# Patient Record
Sex: Female | Born: 1960
Health system: Southern US, Community
[De-identification: ages and names within clinical notes are randomized; demographics above are authoritative.]

## PROBLEM LIST (undated history)

## (undated) DIAGNOSIS — J45909 Unspecified asthma, uncomplicated: Principal | ICD-10-CM

## (undated) DIAGNOSIS — I4949 Other premature depolarization: Secondary | ICD-10-CM

## (undated) DIAGNOSIS — K760 Fatty (change of) liver, not elsewhere classified: Secondary | ICD-10-CM

## (undated) DIAGNOSIS — M359 Systemic involvement of connective tissue, unspecified: Secondary | ICD-10-CM

## (undated) DIAGNOSIS — R030 Elevated blood-pressure reading, without diagnosis of hypertension: Secondary | ICD-10-CM

## (undated) DIAGNOSIS — K7581 Nonalcoholic steatohepatitis (NASH): Secondary | ICD-10-CM

## (undated) DIAGNOSIS — T7840XA Allergy, unspecified, initial encounter: Secondary | ICD-10-CM

## (undated) DIAGNOSIS — E785 Hyperlipidemia, unspecified: Secondary | ICD-10-CM

## (undated) DIAGNOSIS — E079 Disorder of thyroid, unspecified: Secondary | ICD-10-CM

## (undated) HISTORY — DX: Systemic involvement of connective tissue, unspecified: M35.9

## (undated) HISTORY — PX: ABDOMINAL HYSTERECTOMY: SHX81

## (undated) HISTORY — DX: Allergy, unspecified, initial encounter: T78.40XA

## (undated) HISTORY — PX: KNEE ARTHROSCOPY: SUR90

## (undated) HISTORY — DX: Elevated blood-pressure reading, without diagnosis of hypertension: R03.0

## (undated) HISTORY — PX: COLONOSCOPY: SHX174

## (undated) HISTORY — DX: Other premature depolarization: I49.49

## (undated) HISTORY — DX: Unspecified asthma, uncomplicated: J45.909

## (undated) HISTORY — DX: Hyperlipidemia, unspecified: E78.5

---

## 1993-02-26 HISTORY — PX: GANGLION CYST EXCISION: SHX1691

## 1998-02-16 ENCOUNTER — Encounter: Admission: RE | Admit: 1998-02-16 | Discharge: 1998-03-02 | Payer: Self-pay | Admitting: Family Medicine

## 1998-04-05 ENCOUNTER — Encounter: Admission: RE | Admit: 1998-04-05 | Discharge: 1998-07-04 | Payer: Self-pay | Admitting: Family Medicine

## 1999-07-05 ENCOUNTER — Other Ambulatory Visit: Admission: RE | Admit: 1999-07-05 | Discharge: 1999-07-05 | Payer: Self-pay | Admitting: Obstetrics and Gynecology

## 1999-09-01 ENCOUNTER — Ambulatory Visit (HOSPITAL_COMMUNITY): Admission: RE | Admit: 1999-09-01 | Discharge: 1999-09-01 | Payer: Self-pay | Admitting: Obstetrics and Gynecology

## 2000-12-25 ENCOUNTER — Other Ambulatory Visit: Admission: RE | Admit: 2000-12-25 | Discharge: 2000-12-25 | Payer: Self-pay | Admitting: Obstetrics and Gynecology

## 2001-08-15 ENCOUNTER — Inpatient Hospital Stay (HOSPITAL_COMMUNITY): Admission: AD | Admit: 2001-08-15 | Discharge: 2001-08-18 | Payer: Self-pay | Admitting: Obstetrics and Gynecology

## 2001-08-15 ENCOUNTER — Encounter (INDEPENDENT_AMBULATORY_CARE_PROVIDER_SITE_OTHER): Payer: Self-pay

## 2003-01-23 ENCOUNTER — Emergency Department (HOSPITAL_COMMUNITY): Admission: EM | Admit: 2003-01-23 | Discharge: 2003-01-23 | Payer: Self-pay | Admitting: Emergency Medicine

## 2004-06-14 ENCOUNTER — Encounter: Admission: RE | Admit: 2004-06-14 | Discharge: 2004-06-14 | Payer: Self-pay | Admitting: Family Medicine

## 2004-06-19 ENCOUNTER — Encounter: Admission: RE | Admit: 2004-06-19 | Discharge: 2004-06-19 | Payer: Self-pay | Admitting: Family Medicine

## 2004-06-20 ENCOUNTER — Encounter: Admission: RE | Admit: 2004-06-20 | Discharge: 2004-06-20 | Payer: Self-pay | Admitting: Family Medicine

## 2005-10-31 ENCOUNTER — Ambulatory Visit (HOSPITAL_COMMUNITY): Admission: RE | Admit: 2005-10-31 | Discharge: 2005-10-31 | Payer: Self-pay | Admitting: Orthopedic Surgery

## 2005-11-01 ENCOUNTER — Ambulatory Visit (HOSPITAL_BASED_OUTPATIENT_CLINIC_OR_DEPARTMENT_OTHER): Admission: RE | Admit: 2005-11-01 | Discharge: 2005-11-01 | Payer: Self-pay | Admitting: Orthopedic Surgery

## 2005-11-13 ENCOUNTER — Encounter: Admission: RE | Admit: 2005-11-13 | Discharge: 2006-01-02 | Payer: Self-pay | Admitting: Orthopedic Surgery

## 2005-12-10 ENCOUNTER — Ambulatory Visit: Payer: Self-pay | Admitting: Family Medicine

## 2006-12-09 ENCOUNTER — Telehealth: Payer: Self-pay | Admitting: Family Medicine

## 2007-01-03 ENCOUNTER — Telehealth: Payer: Self-pay | Admitting: Family Medicine

## 2007-01-08 ENCOUNTER — Ambulatory Visit: Payer: Self-pay | Admitting: Licensed Clinical Social Worker

## 2007-01-16 ENCOUNTER — Ambulatory Visit: Payer: Self-pay | Admitting: Licensed Clinical Social Worker

## 2007-01-22 ENCOUNTER — Ambulatory Visit: Payer: Self-pay | Admitting: Licensed Clinical Social Worker

## 2007-02-04 ENCOUNTER — Ambulatory Visit: Payer: Self-pay | Admitting: Licensed Clinical Social Worker

## 2007-02-10 ENCOUNTER — Ambulatory Visit: Payer: Self-pay | Admitting: Licensed Clinical Social Worker

## 2007-03-06 ENCOUNTER — Ambulatory Visit: Payer: Self-pay | Admitting: Licensed Clinical Social Worker

## 2007-03-27 ENCOUNTER — Ambulatory Visit: Payer: Self-pay | Admitting: Licensed Clinical Social Worker

## 2007-05-01 ENCOUNTER — Ambulatory Visit: Payer: Self-pay | Admitting: Licensed Clinical Social Worker

## 2007-07-08 ENCOUNTER — Telehealth (INDEPENDENT_AMBULATORY_CARE_PROVIDER_SITE_OTHER): Payer: Self-pay | Admitting: *Deleted

## 2007-10-30 ENCOUNTER — Encounter: Payer: Self-pay | Admitting: Family Medicine

## 2007-10-30 ENCOUNTER — Ambulatory Visit: Payer: Self-pay | Admitting: Family Medicine

## 2007-11-04 DIAGNOSIS — D2370 Other benign neoplasm of skin of unspecified lower limb, including hip: Secondary | ICD-10-CM | POA: Insufficient documentation

## 2007-12-12 ENCOUNTER — Ambulatory Visit: Payer: Self-pay | Admitting: Family Medicine

## 2007-12-26 ENCOUNTER — Ambulatory Visit: Payer: Self-pay | Admitting: Family Medicine

## 2007-12-26 DIAGNOSIS — R030 Elevated blood-pressure reading, without diagnosis of hypertension: Secondary | ICD-10-CM

## 2007-12-26 HISTORY — DX: Elevated blood-pressure reading, without diagnosis of hypertension: R03.0

## 2008-01-27 ENCOUNTER — Ambulatory Visit (HOSPITAL_BASED_OUTPATIENT_CLINIC_OR_DEPARTMENT_OTHER): Admission: RE | Admit: 2008-01-27 | Discharge: 2008-01-27 | Payer: Self-pay | Admitting: Orthopedic Surgery

## 2008-02-27 HISTORY — PX: WRIST RECONSTRUCTION: SHX2675

## 2008-03-05 ENCOUNTER — Telehealth: Payer: Self-pay | Admitting: Family Medicine

## 2008-06-15 ENCOUNTER — Telehealth: Payer: Self-pay | Admitting: Family Medicine

## 2009-01-19 ENCOUNTER — Telehealth: Payer: Self-pay | Admitting: Family Medicine

## 2009-02-14 ENCOUNTER — Telehealth: Payer: Self-pay | Admitting: Family Medicine

## 2009-04-01 ENCOUNTER — Telehealth: Payer: Self-pay | Admitting: Family Medicine

## 2009-04-19 ENCOUNTER — Ambulatory Visit: Payer: Self-pay | Admitting: Family Medicine

## 2009-04-19 DIAGNOSIS — M1990 Unspecified osteoarthritis, unspecified site: Secondary | ICD-10-CM | POA: Insufficient documentation

## 2009-04-19 DIAGNOSIS — M359 Systemic involvement of connective tissue, unspecified: Secondary | ICD-10-CM

## 2009-04-19 DIAGNOSIS — R0789 Other chest pain: Secondary | ICD-10-CM | POA: Insufficient documentation

## 2009-04-19 HISTORY — DX: Systemic involvement of connective tissue, unspecified: M35.9

## 2009-04-19 LAB — CONVERTED CEMR LAB
Folate: 11.8 ng/mL
Rheumatoid fact SerPl-aCnc: <20 intl units/mL (ref 0.0–20.0)
Sed Rate: 24 mm/hr — ABNORMAL HIGH (ref 0–22)
Vitamin B-12: 495 pg/mL (ref 211–911)

## 2009-04-20 ENCOUNTER — Telehealth: Payer: Self-pay | Admitting: Family Medicine

## 2009-04-20 ENCOUNTER — Encounter: Payer: Self-pay | Admitting: Family Medicine

## 2009-04-20 LAB — CONVERTED CEMR LAB: Anti Nuclear Antibody(ANA): NEGATIVE

## 2009-04-26 ENCOUNTER — Telehealth: Payer: Self-pay | Admitting: Family Medicine

## 2009-04-26 ENCOUNTER — Ambulatory Visit: Payer: Self-pay | Admitting: Family Medicine

## 2009-04-26 DIAGNOSIS — J45909 Unspecified asthma, uncomplicated: Secondary | ICD-10-CM

## 2009-04-26 HISTORY — DX: Unspecified asthma, uncomplicated: J45.909

## 2009-04-27 ENCOUNTER — Ambulatory Visit: Payer: Self-pay | Admitting: Family Medicine

## 2009-04-29 ENCOUNTER — Ambulatory Visit: Payer: Self-pay | Admitting: Family Medicine

## 2009-05-31 ENCOUNTER — Telehealth: Payer: Self-pay | Admitting: Family Medicine

## 2009-06-02 ENCOUNTER — Encounter: Payer: Self-pay | Admitting: Family Medicine

## 2009-07-09 DIAGNOSIS — I4949 Other premature depolarization: Secondary | ICD-10-CM

## 2009-07-09 HISTORY — DX: Other premature depolarization: I49.49

## 2009-07-12 ENCOUNTER — Ambulatory Visit: Payer: Self-pay | Admitting: Family Medicine

## 2009-07-12 ENCOUNTER — Telehealth: Payer: Self-pay | Admitting: Family Medicine

## 2009-08-02 ENCOUNTER — Ambulatory Visit: Payer: Self-pay

## 2009-08-02 ENCOUNTER — Ambulatory Visit (HOSPITAL_COMMUNITY): Admission: RE | Admit: 2009-08-02 | Discharge: 2009-08-02 | Payer: Self-pay | Admitting: Family Medicine

## 2009-08-02 ENCOUNTER — Ambulatory Visit: Payer: Self-pay | Admitting: Cardiovascular Disease

## 2009-08-02 ENCOUNTER — Encounter: Payer: Self-pay | Admitting: Family Medicine

## 2010-03-19 ENCOUNTER — Encounter: Payer: Self-pay | Admitting: Family Medicine

## 2010-03-22 ENCOUNTER — Emergency Department (HOSPITAL_BASED_OUTPATIENT_CLINIC_OR_DEPARTMENT_OTHER)
Admission: EM | Admit: 2010-03-22 | Discharge: 2010-03-22 | Payer: Self-pay | Source: Home / Self Care | Admitting: Emergency Medicine

## 2010-03-28 NOTE — Letter (Signed)
Summary: Referral - not able to see patient  [facility]  [address] [place], [place] [address]   Phone: [phone]  Fax: [phone]    June 02, 2009    Tinnie Gens A. Tawanna Cooler, M.D. [address] [place], [place] [address]   Re:   Laura Caldwell DOB:  08-Apr-1960 MRN:   [id number]    Dear Dr. Tawanna Cooler:  Thank you for your kind referral of the above patient.  We have attempted to schedule the recommended procedure Screening Colonoscopy but have not been able to schedule because:  ___ The patient was not available by phone and/or has not returned our calls.   X  The patient declined to schedule the procedure at this time.  We appreciate the referral and hope that we will have the opportunity to treat this patient in the future.    Sincerely,    [facility] [phone]

## 2010-03-28 NOTE — Assessment & Plan Note (Signed)
Summary: ACUTE/HEART ISSUES/RCD   Vital Signs:  Patient profile:   50 year old female Temp:     98 degrees F Pulse rate:   70 / minute Pulse rhythm:   irregular Resp:     12 per minute  History of Present Illness: Laura Caldwell is a 50 year old, married female, nonsmoker, who comes in today for evaluation of skipping heart beat.  She states over the weekend.  She noticed her heart skipping.  He comes in to [place].  It's not associated with chest pain, shortness of breath, etc..  It usually occurs at rest.  He will skip every fourth beat and then last for a couple minutes and stopped.  She's never had history of any cardiac or pulmonary disease.  She only drinks 1 cup of caffeinated coffee daily.  Does not drink alcohol nor smoke.  She recently started an exercise program because she states at age 50.  It's time to lose weight.    Allergies: No Known Drug Allergies  Past History:  Past medical, surgical, family and social histories (including risk factors) reviewed for relevance to current acute and chronic problems.  Past Medical History: Reviewed history from 12/12/2007 and no changes required. childbirth x 2 hysterectomy for nonmalignant reasons lumbar disk disease  Family History: Reviewed history from 12/12/2007 and no changes required. Family History of Alcoholism/Addiction Family History Hypertension  Social History: Reviewed history from 12/12/2007 and no changes required. Occupation:  Charity fundraiser Married Never Smoked Alcohol use-no Drug use-no Regular exercise-no  Review of Systems      See HPI  Physical Exam  General:  Well-developed,well-nourished,in no acute distress; alert,appropriate and cooperative throughout examination Neck:  No deformities, masses, or tenderness noted. Lungs:  Normal respiratory effort, chest expands symmetrically. Lungs are clear to auscultation, no crackles or wheezes. Heart:  Normal rate and regular rhythm. S1 and S2 normal without gallop, murmur,  click, rub or other extra sounds.   Impression & Recommendations:  Problem # 1:  PREMATURE VENTRICULAR CONTRACTIONS (ICD-427.69) Assessment New  Orders: Echo Referral (Echo) EKG w/ Interpretation (93000)  Complete Medication List: 1)  Zomig Zmt 5 Mg Tbdp (Zolmitriptan) .... Take 1 tablet by mouth 2)  Levothroid 50 Mcg Tabs (Levothyroxine sodium) .... Take one tab once daily 3)  Hydrochlorothiazide 25 Mg Tabs (Hydrochlorothiazide) .... Take 1 tablet by mouth every morning 4)  Promethazine-codeine 6.25-10 Mg/65ml Syrp (Promethazine-codeine) .Marland Kitchen.. 1-2 t at bedtime as needed 5)  Prednisone 20 Mg Tabs (Prednisone) .... Uad 6)  Doxycycline Hyclate 100 Mg Caps (Doxycycline hyclate) .... Take 1 tablet by mouth two times a day 7)  Ativan 0.5 Mg Tabs (Lorazepam) .Marland Kitchen.. 1 tab @ bedtime 8)  Promethazine-codeine 6.25-10 Mg/41ml Syrp (Promethazine-codeine) .Marland Kitchen.. 1 or 2 tsps three times a day as needed cough  Patient Instructions: 1)  we discussed a child of a caffeine free diet. 2)  We also discussed medications. 3)  We will get a 2-D echo to be sure.  Her bowels and heart muscle is normal.

## 2010-03-28 NOTE — Progress Notes (Signed)
Summary: tick bite  Phone Note Call from Patient   Caller: Patient Call For: Roderick Pee MD Summary of Call: Pt pulled a tick off of her left hip, and wants to know if she needs to be seen or any meds.? [facility] [facility] [phone] Initial call taken by: Lynann Beaver CMA,  May 31, 2009 10:57 AM  Follow-up for Phone Call        Phone Call Completed Follow-up by: Roderick Pee MD,  May 31, 2009 12:56 PM

## 2010-03-28 NOTE — Progress Notes (Signed)
Summary: lab tests  Phone Note Call from Patient   Caller: Patient Reason for Call: Talk to Doctor Summary of Call: patient is calling because she is coming in for her lab work this afternoon.  she says that her insurance company will not cover all of the test ordered because she just had them done at her GYN.  is it okay to leave off some of the tests?  her cell number is [phone] Initial call taken by: Kern Reap CMA Duncan Dull),  April 20, 2009 9:05 AM  Follow-up for Phone Call        okay to scratch off, if it's already been done Follow-up by: Roderick Pee MD,  April 20, 2009 9:11 AM  Additional Follow-up for Phone Call Additional follow up Details #1::        patient is aware Additional Follow-up by: Kern Reap CMA Duncan Dull),  April 20, 2009 10:04 AM

## 2010-03-28 NOTE — Assessment & Plan Note (Signed)
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  They wish to  She has a history of underlying migraine headaches for which he takes Zomig p.r.n.  She also uses Flexeril and Vicodin p.r.n. for low back pain.  We recently started her on Synthroid 50 micrograms daily for hypothyroidism.  She is having some chest wall pain.  She feels it is difficult to describe.  Not sharp, but not dull pain.  She points to her left anterior chest wall as a source of pain.  It sometimes radiates to the back.  It would last for minutes or hours.  No cardiac or pulmonary symptoms.  She also has some knots in her elbows.  He just recently popped up the nonpleuritic.  No history,.  She also has some erythema of her hands, especially with change in temperature.  She's tried Motrin and Celebrex in the past for joint pain, but can make her hands swell.  She's also noticed some edema of her lower extremities.  Now that she has a sedentary job.  She recently went to see Dr. Tenny Craw at a pelvic breast exam mammogram, all of which were normal.  She's had her uterus removed in one ovary.  Her one ovary is intact.  However, she is having perimenopausal symptoms.  Flushes.  She does not get routine eye care.  She had LASIK surgery 10 years ago.  She gets routine dental care does BSE monthly and gets annual mammography.  She's also due to have a colonoscopy this year.  Last tetanus booster unknown,  She's also had a history of an autoimmune disease of unknown etiology.  Her symptoms wax and wane would never been able to put a finger on it  Allergies: No Known Drug Allergies  Past History:  Past medical, surgical, family and  social histories (including risk factors) reviewed, and no changes noted (except as noted below).  Past Medical History: Reviewed history from 12/12/2007 and no changes required. childbirth x 2 hysterectomy for nonmalignant reasons lumbar disk disease  Family History: Reviewed history from 12/12/2007 and no changes required. Family History of Alcoholism/Addiction Family History Hypertension  Social History: Reviewed history from 12/12/2007 and no changes required. 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 No apparent alopecia or balding. Eyes:  No corneal or conjunctival inflammation noted. EOMI. Perrla. Funduscopic exam benign, without hemorrhages, exudates or papilledema. Vision grossly normal. Ears:  External ear exam shows no significant lesions or deformities.  Otoscopic examination reveals clear canals, tympanic membranes are intact bilaterally without bulging, retraction, inflammation or discharge. Hearing is grossly normal bilaterally. Nose:  External nasal examination shows no deformity or inflammation. Nasal mucosa are pink and moist without lesions or exudates. Mouth:  Oral mucosa and oropharynx without lesions or exudates.  Teeth in good repair. Neck:  No deformities, masses, or tenderness noted. Chest Wall:  No deformities, masses, or tenderness noted. Lungs:  Normal respiratory effort, chest expands symmetrically.  Lungs are clear to auscultation, no crackles or wheezes. Heart:  Normal rate and regular rhythm. S1 and S2 normal without gallop, murmur, click, rub or other extra sounds. Abdomen:  Bowel sounds positive,abdomen soft and non-tender without masses, organomegaly or hernias  noted. Rectal:  gyn  Genitalia:  gyn Msk:  No deformity or scoliosis noted of thoracic or lumbar spine.   Pulses:  R and L carotid,radial,femoral,dorsalis pedis and posterior tibial pulses are full and equal bilaterally Extremities:  No clubbing, cyanosis, edema, or deformity noted with normal full range of motion of all joints.   Neurologic:  No cranial nerve deficits noted. Station and gait are normal. Plantar reflexes are down-going bilaterally. DTRs are symmetrical throughout. Sensory, motor and coordinative functions appear intact. Skin:  skin exam normal except for nodules on her elbows Cervical Nodes:  No lymphadenopathy noted Axillary Nodes:  No palpable lymphadenopathy Inguinal Nodes:  No significant adenopathy Psych:  Cognition and judgment appear intact. Alert and cooperative with normal attention span and concentration. No apparent delusions, illusions, hallucinations   Impression & Recommendations:  Problem # 1:  ELEVATED BP READING WITHOUT DX HYPERTENSION (ICD-796.2) Assessment Unchanged  Her updated medication list for this problem includes:    Hydrochlorothiazide 25 Mg Tabs (Hydrochlorothiazide) .Marland Kitchen... 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.49) Assessment: Unchanged  Orders: Venipuncture (32355) TLB-Lipid Panel (80061-LIPID) TLB-BMP (Basic Metabolic Panel-BMET) (80048-METABOL) TLB-CBC Platelet - w/Differential  (85025-CBCD) TLB-Hepatic/Liver Function Pnl (80076-HEPATIC) TLB-TSH (Thyroid Stimulating Hormone) (84443-TSH) TLB-B12 + Folate Pnl (73220_25427-C62/BJS) TLB-Rheumatoid Factor (RA) (28315-VV) TLB-Sedimentation Rate (ESR) (85652-ESR) T-Antinuclear Antib (ANA) (61607-37106) Prescription Created Electronically [phone])  Problem # 3:  CHEST PAIN, ATYPICAL (ICD-786.59) Assessment: New  Orders: Venipuncture (54627) TLB-Lipid Panel (80061-LIPID) TLB-BMP (Basic Metabolic Panel-BMET) (80048-METABOL) TLB-CBC Platelet - w/Differential (85025-CBCD) TLB-Hepatic/Liver Function Pnl (80076-HEPATIC) TLB-TSH (Thyroid Stimulating Hormone) (84443-TSH) TLB-B12 + Folate Pnl (03500_93818-E99/BZJ) TLB-Rheumatoid Factor (RA) (69678-LF) TLB-Sedimentation Rate (ESR) (85652-ESR) T-Antinuclear Antib (ANA) (81017-51025) Prescription Created Electronically [phone]) EKG w/ Interpretation (93000)  Complete Medication List: 1)  Zomig Zmt 5 Mg Tbdp (Zolmitriptan) .... Take 1 tablet by mouth 2)  Prednisone 20 Mg Tabs (Prednisone) .... Use as directed. 3)  Flexeril 10 Mg Tabs (Cyclobenzaprine hcl) .... One three times a day prn 4)  Vicodin Es 7.5-750 Mg Tabs (Hydrocodone-acetaminophen) .... One by mouth three times a day prn 5)  Levothroid 50 Mcg Tabs (Levothyroxine sodium) .... Take one tab once daily 6)  Hydrochlorothiazide 25 Mg Tabs (Hydrochlorothiazide) .... Take 1 tablet by mouth every morning  Other Orders: Gastroenterology Referral (GI) Tdap => 38yrs IM (82423) Admin 1st Vaccine (53614)  Patient Instructions: 1)  Please schedule a follow-up appointment in 1 year. 2)  It is important that you exercise regularly at least 20 minutes 5 times a week. If you develop chest pain, have severe difficulty breathing, or feel very tired , stop exercising immediately and seek medical attention. 3)  Schedule your mammogram. 4)  Schedule a colonoscopy/sigmoidoscopy to help detect colon cancer. 5)  Take calcium +Vitamin D  daily. 6)  Take an Aspirin every day. 7)  check your BP daily, x 3 weeks fax me the report. 8)  Start HCTZ 25 mg q.a.m.  to help decrease the peripheral edema.  Remember to stay away from salt and walk 20 minutes daily. 9)  Call Laura  Caldwell at [facility], dermatologyfor consult Prescriptions: LEVOTHROID 50 MCG TABS (LEVOTHYROXINE SODIUM) take one tab once daily  #100 x 3   Entered and Authorized by:   Roderick Pee MD   Signed by:   Roderick Pee MD on 04/19/2009   Method used:   [facility]        [phone][facility] ([facility]ail[facility]       [address]       [place], [place]  [address]       Ph: [phone] or [phone]       Fax: [phone]   RxID:   [id number] ZOMIG ZMT 5 MG TBDP (ZOLMITRIPTAN) Take 1 tablet by mouth  #6 x 3   Entered and Authorized by:   Roderick Pee MD   Signed by:   Roderick Pee MD on 04/19/2009   Method used:   Electronically to        [facility] [phone][facility] (retail)       [address]       [place], [place]  [address]       Ph: [phone] or [phone]       Fax: [phone]   RxID:   [id number] HYDROCHLOROTHIAZIDE 25 MG TABS (HYDROCHLOROTHIAZIDE) Take 1 tablet by mouth every morning  #100 x 3   Entered and Authorized by:   Roderick Pee MD   Signed by:   Roderick Pee MD on 04/19/2009   Method used:   Electronically to        [facility] [phone][facility] (retail)       [address]       [place], [place]  [address]       Ph: [phone] or [phone]       Fax: [phone]   RxID:   [id number]    Immunizations Administered:  Tetanus Vaccine:    Vaccine Type: Tdap    Site: left deltoid    Mfr: GlaxoSmithKline    Dose: 0.5 ml    Route: IM    Given by: Kern Reap CMA (AAMA)    Exp. Date: 04/23/2011    Lot #: [id number]    Physician counseled: yes

## 2010-03-28 NOTE — Progress Notes (Signed)
Summary: palpitations    Caller: Patient Call For: Roderick Pee MD Summary of Call: Pt called the Phone Nurse last night with irregular heart rate, but is better and in normal rate today.  Is asking Dr. Tawanna Cooler what he would like her to do???  [phone] Initial call taken by: Lynann Beaver CMA,  Jul 12, 2009 8:42 AM    Additional Follow-up for Phone Call Additional follow up Details #2::    Ariadna was having episodes last night.  Every third beat and skip and she was lightheaded.  He went on for an hour or so.  Referred to Dr. Johney Frame cardiologist for evaluation Follow-up by: Roderick Pee MD,  Jul 12, 2009 9:09 AM   Appended Document: palpitations Made appt. with cardologist for August 16, 2009.

## 2010-03-28 NOTE — Assessment & Plan Note (Signed)
Summary: fill out form/cjr   Vital Signs:  Patient profile:   50 year old female BP sitting:   160 / 100  (left arm) Cuff size:   regular  Vitals Entered By: Kern Reap CMA Duncan Dull) (April 29, 2009 2:01 PM)  Reason for Visit folllow up offie visit  Contraindications/Deferment of Procedures/Staging:    Test/Procedure: Weight Refused    Reason for deferment: patient declined-cannot calculate BMI   History of Present Illness: Laura Caldwell is a 50 y/o married fem . nonsmoker who comes in today for follow-up of asthma.  She is able now to sleep at night.  However, she has sleep dysfunction related to her the prednisone.  Her breathing is better, although she has a sensation of tightness in her chest.  No other side effects from medication.  She would like also a refill of her cough syrup.  No side effects from antibiotics.  She took 40 mg of prednisone today  Allergies: No Known Drug Allergies  Past History:  Past medical, surgical, family and social histories (including risk factors) reviewed for relevance to current acute and chronic problems.  Past Medical History: Reviewed history from 12/12/2007 and no changes required. childbirth x 2 hysterectomy for nonmalignant reasons lumbar disk disease  Family History: Reviewed history from 12/12/2007 and no changes required. Family History of Alcoholism/Addiction Family History Hypertension  Social History: Reviewed history from 12/12/2007 and no changes required. 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 Lungs are clear to auscultation, no crackles or wheezes.   Impression & Recommendations:  Problem # 1:  ASTHMA (ICD-493.90) Assessment  Improved  Her updated medication list for this problem includes:    Prednisone 20 Mg Tabs (Prednisone) ..... Uad  Complete Medication List: 1)  Zomig Zmt 5 Mg Tbdp (Zolmitriptan) .... Take 1 tablet by mouth 2)  Levothroid 50 Mcg Tabs (Levothyroxine sodium) .... Take one tab once daily 3)  Hydrochlorothiazide 25 Mg Tabs (Hydrochlorothiazide) .... Take 1 tablet by mouth every morning 4)  Promethazine-codeine 6.25-10 Mg/55ml Syrp (Promethazine-codeine) .Marland Kitchen.. 1-2 t at bedtime as needed 5)  Prednisone 20 Mg Tabs (Prednisone) .... Uad 6)  Doxycycline Hyclate 100 Mg Caps (Doxycycline hyclate) .... Take 1 tablet by mouth two times a day 7)  Ativan 0.5 Mg Tabs (Lorazepam) .Marland Kitchen.. 1 tab @ bedtime 8)  Promethazine-codeine 6.25-10 Mg/27ml Syrp (Promethazine-codeine) .Marland Kitchen.. 1 or 2 tsps three times a day as needed cough  Patient Instructions: 1)  begin to taper the prednisone by taking 30 mg daily for 3 days, 20 mg daily for 3 days, 10 mg daily for 3 days, then 10 mg Monday, Wednesday, Friday, for a two to 4 week taper. 2)  He may take one or 2 teaspoons of the cough syrup to 3 times a day as needed for cough. 3)  Take Ativan .5 nightly for sleep dysfunction from the prednisone.  Stop the nebulizer.  Finish the doxycycline.  Return p.r.n. 4)  Call Dr. Para Skeans for a dermatologic consult Prescriptions: PROMETHAZINE-CODEINE 6.25-10 MG/5ML SYRP (PROMETHAZINE-CODEINE) 1 or 2 tsps three times a day as needed cough  #8oz x 1   Entered and Authorized by:   Roderick Pee MD   Signed by:   Roderick Pee MD on 04/29/2009   Method used:  Print then Give to Patient   RxID:   [id number] ATIVAN 0.5 MG TABS (LORAZEPAM) 1 tab @ bedtime  #30 x 0   Entered and Authorized by:   Roderick Pee MD   Signed by:   Roderick Pee MD on 04/29/2009   Method used:   Print then Give to Patient   RxID:   [id number]

## 2010-03-28 NOTE — Progress Notes (Signed)
  Phone Note Call from Patient   Reason for Call: Refill Medication Summary of Call: [facility] Initial call taken by: Raechel Ache, RN,  April 26, 2009 9:41 AM  Follow-up for Phone Call        Rx Called In Follow-up by: Raechel Ache, RN,  April 26, 2009 9:41 AM    New/Updated Medications: PROMETHAZINE-CODEINE 6.25-10 MG/5ML SYRP (PROMETHAZINE-CODEINE) 1-2 t at bedtime as needed Prescriptions: PROMETHAZINE-CODEINE 6.25-10 MG/5ML SYRP (PROMETHAZINE-CODEINE) 1-2 t at bedtime as needed  #4oz x 1   Entered by:   Raechel Ache, RN   Authorized by:   Roderick Pee MD   Signed by:   Raechel Ache, RN on 04/26/2009   Method used:   Historical   RxID:   [id number]

## 2010-03-28 NOTE — Assessment & Plan Note (Signed)
Summary: SOB/WHEEZING/NJR 3PM/NJR   Vital Signs:  Patient profile:   50 year old female Temp:     98.9 degrees F oral Pulse rate:   104 / minute Pulse rhythm:   regular BP sitting:   130 / 84  (left arm) Cuff size:   regular  Vitals Entered By: Gladis Riffle, RN (April 26, 2009 3:09 PM) CC: c/o cough, SOB, tightness in chest Is Patient Diabetic? No   CC:  c/o cough, SOB, and tightness in chest.  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  No fever.  No sputum production.  In the, past.  She's had occasions where she had wheezing with viral infections.  Review of systems negative  Preventive Screening-Counseling & Management  Alcohol-Tobacco     Smoking Status: never  Current Medications (verified): 1)  Zomig Zmt 5 Mg Tbdp (Zolmitriptan) .... Take 1 Tablet By Mouth 2)  Levothroid 50 Mcg Tabs (Levothyroxine Sodium) .... Take One Tab Once Daily 3)  Hydrochlorothiazide 25 Mg Tabs (Hydrochlorothiazide) .... Take 1 Tablet By Mouth Every Morning 4)  Promethazine-Codeine 6.25-10 Mg/41ml Syrp (Promethazine-Codeine) .Marland Kitchen.. 1-2 T At Bedtime As Needed  Allergies (verified): No Known Drug Allergies  Past History:  Past medical, surgical, family and social histories (including risk factors) reviewed, and no changes noted (except as noted below).  Past Medical History: Reviewed history from 12/12/2007 and no changes required. childbirth x 2 hysterectomy for nonmalignant reasons lumbar disk disease  Family History: Reviewed history from 12/12/2007 and no changes required. Family History of Alcoholism/Addiction Family History Hypertension  Social History: Reviewed history from 12/12/2007 and no changes required. Occupation:  Charity fundraiser Married Never Smoked Alcohol use-no Drug use-no Regular exercise-no  Review of Systems      See HPI  Physical Exam  General:   Well-developed,well-nourished,in no acute distress; alert,appropriate and cooperative throughout examination Head:  Normocephalic and atraumatic without obvious abnormalities. No apparent alopecia or balding. Eyes:  No corneal or conjunctival inflammation noted. EOMI. Perrla. Funduscopic exam benign, without hemorrhages, exudates or papilledema. Vision grossly normal. Ears:  External ear exam shows no significant lesions or deformities.  Otoscopic examination reveals clear canals, tympanic membranes are intact bilaterally without bulging, retraction, inflammation or discharge. Hearing is grossly normal bilaterally. Nose:  External nasal examination shows no deformity or inflammation. Nasal mucosa are pink and moist without lesions or exudates. Mouth:  Oral mucosa and oropharynx without lesions or exudates.  Teeth in good repair. Neck:  No deformities, masses, or tenderness noted. Chest Wall:  No deformities, masses, or tenderness noted. Lungs:  symmetrical breath sounds bilateral wheezing   Problems:  Medical Problems Added: 1)  Dx of Asthma  (ICD-493.90)  Impression & Recommendations:  Problem # 1:  ASTHMA (ICD-493.90) Assessment New  The following medications were removed from the medication list:    Prednisone 20 Mg Tabs (Prednisone) ..... Use as directed. Her updated medication list for this problem includes:    Prednisone 20 Mg Tabs (Prednisone) ..... Uad  Orders: Prescription Created Electronically [phone]) Nebulizer Tx ([id number])  Complete Medication List: 1)  Zomig Zmt 5 Mg Tbdp (Zolmitriptan) .... Take 1 tablet by mouth 2)  Levothroid 50 Mcg Tabs (Levothyroxine sodium) .... Take one tab once daily 3)  Hydrochlorothiazide 25 Mg Tabs (Hydrochlorothiazide) .... Take 1 tablet by mouth every morning 4)  Promethazine-codeine 6.25-10 Mg/43ml Syrp (Promethazine-codeine) .Marland Kitchen.. 1-2 t at bedtime as needed 5)  Prednisone 20 Mg Tabs (Prednisone) .... Uad 6)  Doxycycline Hyclate 100 Mg Caps  (Doxycycline hyclate) .... Take 1 tablet by mouth two times a day  Patient Instructions: 1)  drink 30 ounces of water daily, run a vaporizer in y  bedroom, take one  teaspoon of the cough syrup 3 times a day as needed, begin prednisone 3 tabs now.......Marland Kitchentwo tabs at bedtime......... two tabs in the morning. 2)  Use the nebulizer with albuterol every 4 hours as needed. 3)  Begin doxycycline 100 mg b.i.d. 4)  Return in the morning 8 15 for follow-up Prescriptions: DOXYCYCLINE HYCLATE 100 MG CAPS (DOXYCYCLINE HYCLATE) Take 1 tablet by mouth two times a day  #20 x 0   Entered and Authorized by:   Roderick Pee MD   Signed by:   Roderick Pee MD on 04/26/2009   Method used:   Electronically to        [facility] [phone]* (retail)       [address]       [place], [place]  [address]       Ph: [phone] or [phone]       Fax: [phone]   RxID:   [id number] PREDNISONE 20 MG TABS (PREDNISONE) UAD  #30 x 1   Entered and Authorized by:   Roderick Pee MD   Signed by:   Roderick Pee MD on 04/26/2009   Method used:   Electronically to        [facility] [phone]* (retail)       [address]       [place], [place]  [address]       Ph: [phone] or [phone]       Fax: [phone]   RxID:   [id number]

## 2010-03-28 NOTE — Assessment & Plan Note (Signed)
Summary: //ROA   Vital Signs:  Patient profile:   50 year old female BP sitting:   124 / 94  (left arm) Cuff size:   regular  Reason for Visit follow up office visit  History of Present Illness: Laura Caldwell is a 50 year old female, married, nonsmoker, who comes in today for asthma.  She was seen yesterday with severe asthma.  It was triggered by a viral infection.  She is a nonsmoker and no history of allergic rhinitis or allergic asthma.  She has had spells of asthma in the past.  All triggered by viral infections.  Her last episode was 3 years ago.  She is currently on prednisone 40 mg daily, water, nebulizer with albuterol q.i.d., doxycycline, 100 mg b.i.d. and Phenergan with codeine as a cough suppressant.  She states she feels a little bit better, but not much.  No medicine side effects  Allergies: No Known Drug Allergies  Social History: Reviewed history from 12/12/2007 and no changes required. 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  Has stopped.   Impression & Recommendations:  Problem # 1:  ASTHMA (ICD-493.90) Assessment Improved  Her updated medication list for this problem includes:    Prednisone 20 Mg Tabs (Prednisone) ..... Uad  Complete Medication List: 1)  Zomig Zmt 5 Mg Tbdp (Zolmitriptan) .... Take 1 tablet by mouth 2)  Levothroid 50 Mcg Tabs (Levothyroxine sodium) .... Take one tab once daily 3)  Hydrochlorothiazide 25 Mg Tabs (Hydrochlorothiazide) .... Take 1 tablet by mouth every morning 4)  Promethazine-codeine 6.25-10 Mg/5ml Syrp (Promethazine-codeine) .Marland Kitchen.. 1-2 t at bedtime as needed 5)  Prednisone 20 Mg Tabs (Prednisone) .... Uad 6)  Doxycycline Hyclate  100 Mg Caps (Doxycycline hyclate) .... Take 1 tablet by mouth two times a day  Patient Instructions: 1)  continue your current medications.  If you feel much better on Saturday begin to taper the prednisone.  Take 30 mg a day for 3 days, 20 mg for 3 days, 10 mg a day for 3 days, then 10 mg Monday, Wednesday, Friday, for a two week taper.  Return p.r.n.

## 2010-03-28 NOTE — Progress Notes (Signed)
Summary: Call-A-Nurse Report   Call-A-Nurse Triage Call Report Triage Record Num: [id number] Operator: Jacques Navy Patient Name: Laura Caldwell Call Date & Time: 07/11/2009 9:09:03PM Patient Phone: PCP: Tinnie Gens A. Todd Patient Gender: Female PCP Fax : [phone] Patient DOB: 1960-03-28 [facility] Name: [facility] Reason for Call: Calling about having irregualr heart beat. Currently no SOB or dizziness, but has had that a couple times this weekend. Onset 07-09-09. Afebrile. Eating and drinking well. Urinating well. Activity normal for her. Instructed to call office in am 07-12-09 and make same day appt. Protocol(s) Used: Irregular Heartbeat Recommended Outcome per Protocol: See Provider within 24 hours Reason for Outcome: Taking digitalis, diuretics, beta blockers, calcium channel blockers or thyroid medications AND continues to have ongoing or repeated episodes of irregular pulse or pulse more than 90, or less than 60, beats/minute at rest Care Advice:  ~ Another adult should drive.  ~ Call EMS 272 if pulse greater than 120 beats/minute at rest OR less than 50 beats per minute at rest Call EMS 911 if chest pain, sudden severe shortness of breath, loss of consciousness, or signs of shock (such as unable to stand due to faintness, dizziness, or lightheadedness; new onset of confusion; slow to respond or difficult to awaken; skin is pale, gray, cool, or moist to touch; severe weakness).  ~ MEDICATION ADVICE: - Tell provider all prescription, OTC or alternative medications that you take. - Take medications as prescribed, following label instructions for the medication. - Know possible side effects of medication and what to do if they occur. - Do not change medications or dosing regimen until provider is consulted. - Discontinue all OTC and alternative medications, especially stimulants, until evaluated by provider.  ~ Avoid caffeine (coffee, tea, cola drinks, or chocolate),  alcohol, and nicotine (use of tobacco), as use of these substances may worsen symptoms.  ~ Change position slowly. Sit for a couple of minutes before standing to walk. Quick position changes may cause or worsen symptoms.  ~ Make a list of all allergies you may have and a list of all medications you take, including prescriptions from ALL providers; over-the-counter and alternative/herbal medications. Take both lists with you to the provider.  ~  ~ IMMEDIATE ACTION 07/11/2009 9:19:40PM Page 1 of 1 [email or web address]

## 2010-03-28 NOTE — Progress Notes (Signed)
Summary: new rx  Phone Note Call from Patient Call back at Home Phone [phone]   Caller: Patient Call For: Roderick Pee MD Summary of Call: pt needs generic synthoid 50 micrograms [phone] Initial call taken by: Heron Sabins,  April 01, 2009 2:23 PM    New/Updated Medications: LEVOTHROID 50 MCG TABS (LEVOTHYROXINE SODIUM) take one tab once daily Prescriptions: LEVOTHROID 50 MCG TABS (LEVOTHYROXINE SODIUM) take one tab once daily  #90 x 3   Entered by:   Kern Reap CMA (AAMA)   Authorized by:   Roderick Pee MD   Signed by:   Kern Reap CMA (AAMA) on 04/01/2009   Method used:   Electronically to        [facility] [phone]* (retail)       [address]       [place], [place]  [address]       Ph: [phone] or [phone]       Fax: [phone]   RxID:   [id number] LEVOTHROID 50 MCG TABS (LEVOTHYROXINE SODIUM) take one tab once daily  #90 x 3   Entered by:   Kern Reap CMA (AAMA)   Authorized by:   Roderick Pee MD   Signed by:   Kern Reap CMA (AAMA) on 04/01/2009   Method used:   Print then Give to Patient   RxID:   [id number]  printed rx discarded

## 2010-04-26 ENCOUNTER — Other Ambulatory Visit: Payer: Self-pay | Admitting: Family Medicine

## 2010-04-28 ENCOUNTER — Other Ambulatory Visit: Payer: Self-pay | Admitting: Family Medicine

## 2010-05-10 ENCOUNTER — Ambulatory Visit (INDEPENDENT_AMBULATORY_CARE_PROVIDER_SITE_OTHER): Payer: 59 | Admitting: Family Medicine

## 2010-05-10 ENCOUNTER — Encounter: Payer: Self-pay | Admitting: Family Medicine

## 2010-05-10 VITALS — BP 142/82 | HR 88 | Temp 98.7°F

## 2010-05-10 DIAGNOSIS — J45909 Unspecified asthma, uncomplicated: Secondary | ICD-10-CM

## 2010-05-10 MED ORDER — PREDNISONE 20 MG PO TABS: ORAL_TABLET | ORAL | Status: DC

## 2010-05-10 MED ORDER — HYDROCODONE-HOMATROPINE 5-1.5 MG/5ML PO SYRP: 5.0000 mL | ORAL_SOLUTION | Freq: Four times a day (QID) | ORAL | Status: DC | PRN

## 2010-05-10 NOTE — Progress Notes (Signed)
  Subjective:    Patient ID: Laura Caldwell, female    DOB: 11/12/1960, 50 y.o.   MRN: [id number] Caelie is a 50 year old, married female, nonsmoker, who comes in today for a 3 day history of head congestion, sore throat, cough, and voice loss.  Because of the voice loss.  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   HPI    Review of Systems    Negative except for previous history of asthma Objective:   Physical Exam    Well-developed well-nourished, female, in no acute distress.  HEENT negative except for bilateral ear wax.  Neck was supple.  No adenopathy.  Lungs are clear except for late expiratory wheezing    Assessment & Plan:  Viral syndrome with secondary asthma.  Plan increase fluids.  Hydromet p.r.n. For cough.  Prednisone 40 mg daily with a taper.  Return p.r.n.

## 2010-05-10 NOTE — Patient Instructions (Signed)
Drink lots of water.  Voice rest.  Prednisone as directed.  Hydromet is directed.  Return p.r.n..  Stay out of work until next Monday

## 2010-05-26 ENCOUNTER — Other Ambulatory Visit: Payer: Self-pay | Admitting: Family Medicine

## 2010-07-11 NOTE — Op Note (Signed)
NAME:  Laura Caldwell, Laura Caldwell               ACCOUNT NO.:  [id number]   MEDICAL RECORD NO.:  [id number]          PATIENT TYPE:  AMB   LOCATION:  DSC                          FACILITY:  [facility]   PHYSICIAN:  Katy Fitch. Sypher, M.D. DATE OF BIRTH:  03-May-1960   DATE OF PROCEDURE:  01/27/2008  DATE OF DISCHARGE:                               OPERATIVE REPORT   PREOPERATIVE DIAGNOSIS:  Probable stage IIIB Kienbock disease right  wrist with fragmentation, collapse and deformity of lunate with  extrusion of dorsal lunate fragment and radioscaphoid angle greater than  70 degrees.   POSTOPERATIVE DIAGNOSIS:  Probable early stage IV Kienbock disease with  all of the aforementioned pathology and additional finding of grade 2  and 3 chondromalacia of lunate facet of radius.   OPERATION:  1. Exploration of right wrist with midcarpal and radiocarpal      synovectomy.  2. Cheilectomy proximal pole scaphoid.  3. Reduction of malrotated scaphoid and scaphocapitate fusion      utilizing two Mini Acutrak 2 screws.   SURGEON:  Katy Fitch. Sypher, MD   ASSISTANT:  Annye Rusk PA-C   ANESTHESIA:  General by LMA.   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   She was referred by Dr. Kelle Darting for evaluation and management of  progressive wrist pain and stiffness on the right.  Clinical examination  by another orthopedic physician had revealed wrist pain.  She was  treated with Medrol Dosepak and steroid injection.  Her symptoms did not  improve.  Therefore, she sought Dr. Nelida Meuse suggestion for an upper  extremity orthopedic consult.   Clinical examination revealed impairment of wrist motion with  dorsiflexion 55 degrees and palmar flexion 30 degrees.  Her grip  strength was less than 50% on the contralateral side.  Plain films of  the  wrist demonstrated Kienbck disease graded at IIIB due to collapse  of the lunate, loss of carpal height and rotation of the scaphoid.   We had a lengthy informed consent with Ms. Salceda recommending either  scaphocapitate fusion or possible proximal row carpectomy.   She fully understood that we would need to explore the wrist and  determine the degree of lunate pathology, radiocarpal pathology and  midcarpal pathology prior to selecting a final treatment option.   Preoperatively, she was advised leading towards a scaphocapitate fusion  in an effort to preserve options for a complete wrist arthrodesis at a  later date if necessary.   Preoperatively, she was interviewed in the holding area, questions were  once again reviewed and answered in detail.  Other risks of surgery  including infection, anesthetic complications, neurovascular injury and  failure to heal and arthrodesis were described in detail.   Preoperatively, she was interviewed by Dr. Noreene Larsson in the holding area  and advised to select either regional anesthesia or general anesthesia  by LMA technique.  She selected general anesthesia by LMA.   After informed consent, she is brought  to the operating room at this  time.   PROCEDURE:  Laura Caldwell is brought to the operating room and placed in  supine position upon the operating table.  Under Dr. Morley Kos direct  supervision, general anesthesia by LMA technique was induced.   The right upper extremity was prepped with Betadine soap solution and  sterilely draped.  A pneumatic tourniquet was applied to the proximal  right brachium.  Ancef 1 g was administered as an IV prophylactic  antibiotic followed by exsanguination of the right arm with an Esmarch  bandage and inflation of arterial tourniquet to 220 mmHg.  The procedure  commenced with careful examination of the wrist under anesthesia.  Under  anesthesia, Ms. Goebel was noted to have 55 degrees of dorsiflexion, 30   degrees of palmar flexion, 15 degrees of radial deviation and 20 degrees  of ulnar deviation.  Procedure commenced with a 4-cm longitudinal  incision directly over the radial aspect of the fourth dorsal  compartment.  Subcutaneous tissues were carefully divided taking care to  identify the extensor pollicis longus and finger extensors.  The  extensor retinaculum was split along the ulnar aspect of the third  dorsal compartment and the extensor tendons retracted.  The posterior  interosseous nerve was identified and resected.  The capsule was incised  longitudinally and abundant synovitis was noted.  After synovectomy of  the midcarpal and radiocarpal articulations, the joint surfaces were  carefully analyzed.  There was noted to be an osteophyte forming on the  dorsal pole of the scaphoid due to chronic malrotation.  The lunate  facet of the radius was carefully inspected and found to have grade 2  and 3 chondromalacia.  There was a small degree of chondromalacia  beginning to form on the radioscaphoid articulation.  The scaphoid was  reducible to approximately  40 degree radioscaphoid angle.  We elected  to proceed with a scaphocapitate fusion in an effort to by time before  either wrist arthrodesis or arthroplasty.  Given the chondromalacia in  the lunate facet, she was not a proper candidate for proximal row  carpectomy.   The adjacent surfaces of the scaphoid and capitate, as well as the SCT  joint were denuded of hyaline cartilage followed by feathering with an  osteotome for bone-on-bone articulation.  With great care, a small  incision was fashioned in the snuffbox and the distal half of the  scaphoid exposed.  Care was taken to retract the extensor tendons to the  thumb, the radial superficial sensory branches and the radial artery.  A  drill guide was used directly on the scaphoid to place two 0.045 inch  Kirschner wires protecting soft tissue structures with the scaphoid   reduced to a radioscaphoid angle of approximately 40 degrees.  Excellent  fixation was achieved.   With great care two Acutrak 2 Mini screws were placed under direct  vision with hand reaming taking care to protect the neurovascular  structures.  Excellent compression of the reduced scaphoid was achieved.   The wrist joint was then thoroughly irrigated followed by anatomic  repair of the capsule and dorsal ligaments with multiple figure-of-eight  sutures of 3-0 Ethibond, followed by repair of the extensor retinaculum  with multiple interrupted sutures of 3-0 Ethibond.  The skin was  repaired with subcutaneous suture of 4-0 Vicryl and intradermal 3-0  Prolene.   The tourniquet was released with immediate capillary refill of the  fingers and thumb.  There were no apparent complications.  Ms. Cadiente  was transferred to the recovery room for observation of her vital signs.   We anticipate discharge prescriptions for Dilaudid 2 mg one to two  tablets p.o. q.4-6 h. p.r.n. pain 20 tablets without refill.  Also  Motrin 600 mg one p.o. q.6 h. p.r.n. pain 30 tablets without refill and  Keflex 500 mg one p.o. q.8 h. x4 days as prophylactic antibiotic.      Katy Fitch Sypher, M.D.  Electronically Signed     RVS/MEDQ  D:  01/27/2008  T:  01/28/2008  Job:  [id number]   cc:   Tinnie Gens A. Tawanna Cooler, MD

## 2010-07-14 NOTE — Op Note (Signed)
NAME:  Laura Caldwell, Laura Caldwell NO.:  [id number]   MEDICAL RECORD NO.:  [id number]          PATIENT TYPE:  OUT   LOCATION:  MRI                          FACILITY:  [facility]   PHYSICIAN:  Thereasa Distance A. Mortenson, M.D.DATE OF BIRTH:  16-Dec-1960   DATE OF PROCEDURE:  11/01/2005  DATE OF DISCHARGE:                                 OPERATIVE REPORT   PREOPERATIVE DIAGNOSES:  Loose bodies, left knee; and osteoarthritis, medial  femoral condyle, left knee.   POSTOPERATIVE DIAGNOSES:  Multiple loose bodies, left knee; large  osteochondral defect, weightbearing area of medial femoral condyle, left  knee.   OPERATIONS:  Removal of cartilaginous loose bodies, microfracture and  chondroplasty of large osteochondral defect, weightbearing area of medial  femoral condyle, left knee.   SURGEON:  Lenard Galloway. Chaney Malling, M.D.   ANESTHESIA:  General.   PATHOLOGY:  With the arthroscope in the knee, a very careful examination of  the knee was undertaken.  There was grade 2 cartilage damage to the  posterior aspect of the patella and the femoral notch area.  In the lateral  compartment, there was normal articular cartilage over the lateral femoral  condyle and lateral tibial plateau.  The entire circumference of the lateral  meniscus appeared normal.  There were 2 cartilaginous loose bodies in the  lateral compartment.  The arthroscope was then placed in the intercondylar  notch and the ACL was normal.  In the medial compartment, the medial  meniscus was examined very carefully, and this was absolutely normal.  The  medial tibial plateau appeared normal.  There was a very large chondral  defect in the weightbearing of the medial femoral condyle, which measured  probably 1 to 2 cm in diameter, and this went down to subchondral bone.  The  margins of cartilage were frayed and torn.   PROCEDURE:  The patient was placed on the operating table in the supine  position and the pneumatic tourniquet  applied to the left upper thigh.  The  entire left lower extremity was prepared with DuraPrep and draped out in the  usual manner.  An infusion cannula was placed in the superomedial pouch and  the knee distended with saline.  Anteromedial and anterolateral portals were  made and the arthroscope was introduced.  The findings were as described  above.  Attention was first turned to the posterior aspect of the patella in  the femoral neck area, and a chondroplastic shaver was used to debride the  patella and the femoral notch area.  The arthroscope was then placed in the  lateral compartment and 2 cartilaginous loose bodies were removed.  The  arthroscope was then placed in the medial compartment.  There were marked  fraying and tearing of articular cartilage around a large chondral defect,  and these margins were debrided to fairly stable cartilage.  The  microfracture set was then used, and multiple holes were drilled into the  area of defect.  This was followed with the intra-articular shaver, and all  debris was removed.  Loose cartilage was removed.  There was bleeding  through the  bony holes in the medial femoral condyle.  I was quite satisfied  with the preparation of this denuded area.  Another search was then made for  any other loose bodies in the knee.  The posteromedial recess was examined  very carefully and the posterior aspect of the popliteal area of the knee  was milked, but no other loose bodies were issued forth from the posterior  aspect of the knee.  The knee was then filled with Marcaine and a large  bulky pressure dressing applied.  The patient then returned to the recovery  room in excellent condition.  Technically, this procedure went extremely  well.   FOLLOWUP CARE:  1. To my office on Wednesday.  2. Percocet for pain.           ______________________________  Lenard Galloway. Chaney Malling, M.D.     RAM/MEDQ  D:  11/01/2005  T:  11/01/2005  Job:  [id number]

## 2010-07-14 NOTE — Discharge Summary (Signed)
[facility]  Patient:    Laura Caldwell, Laura Caldwell Visit Number: [id number] MRN: [id number]          Service Type: GYN Location: 9300 9310 [id number] Attending Physician:  Miguel Aschoff Dictated by:   Miguel Aschoff, M.D. Admit Date:  08/15/2001 Discharge Date: 08/18/2001                             Discharge Summary  ADMISSION DIAGNOSES:          1. Menorrhagia.                               2. Dysmenorrhea.                               3. Pelvic pain.                               4. Urinary stress incontinence.  FINAL DIAGNOSES:              1. Menorrhagia.                               2. Dysmenorrhea.                               3. Pelvic pain.                               4. Urinary stress incontinence.  PROCEDURE:                    Total abdominal hysterectomy, left salpingo-oophorectomy, Burch procedure, cystoscopy.  COMPLICATIONS:                None.  HISTORY OF PRESENT ILLNESS:   The patient is a 50 year old white female with a history of menorrhagia, progressive dysmenorrhea associated with left lower quadrant pain.  The patient had been treated conservatively, however, continued to have heavy bleeding and pain.  In addition, she was significantly bothered by her urinary stress incontinence.  On examination, she was noted to have a normal size uterus, a small cystocele, but a significant urethrocele with loss of the posterior urethrovesical angle.  HOSPITAL COURSE:              Because of her symptoms, she was taken to the operating room where on June 20, a total abdominal hysterectomy and left salpingo-oophorectomy were carried out without difficulty.  After this was done, Dr. Edward Jolly proceeded to perform a Burch procedure followed by cystoscopy and suprapubic catheter placement.  The patients postoperative course was essentially uneventful.  She tolerated increasing ambulation and diet well. She was able to void spontaneously with small residual  volume between 50 and 100 cc.  It was possible on June 23, to remove her suprapubic catheter and Jackson-Pratt drain and discharge the patient.  She was sent home and instructed to do no heavy lifting, to call if there were any problems such as fever, pain, or heavy bleeding.  She was instructed to place nothing in the vagina.  She will be seen back in four weeks for follow-up  examination.  FINAL DIAGNOSES:              Menorrhagia, dysmenorrhea, pelvic pain, urinary stress incontinence.  CONDITION ON DISCHARGE:       Satisfactory.  Final pathology is pending. Dictated by:   Miguel Aschoff, M.D. Attending Physician:  Miguel Aschoff DD:  08/18/01 TD:  08/18/01 Job: [id number]

## 2010-07-14 NOTE — Op Note (Signed)
[facility]  Patient:    Laura Caldwell, Laura Caldwell Visit Number: [id number] MRN: [id number]          Service Type: OBS Location: 9300 [id number] Attending Physician:  Miguel Aschoff Dictated by:   Devoria Albe Edward Jolly, M.D. Proc. Date: 08/15/01 Admit Date:  08/15/2001                             Operative Report  PREOPERATIVE DIAGNOSIS:       Genuine stress incontinence.  POSTOPERATIVE DIAGNOSIS:      Genuine stress incontinence.  OPERATION:                    1. Abdominal Burch procedure.                               2. Cystoscopy.                               3. Suprapubic catheter placement.  SURGEON:                      Brook A. Edward Jolly, M.D.  ASSISTANT:                    Miguel Aschoff, M.D.  ANESTHESIA:                   General endotracheal anesthesia.  COMPLICATIONS:                None.  INDICATIONS:                  The patient is a 50 year old Caucasian female with a history of genuine stress incontinence who requested surgical treatment at the time of her scheduled total abdominal hysterectomy by Dr. Miguel Aschoff. Dr. Tenny Craw had previously counselled the patient in the office regarding risks and benefits of the Burch procedure, and she chose to proceed at the time of her hysterectomy.  FINDINGS:                     Cystoscopy performed at the end of the Burch procedure demonstrated the ureters to be patent bilaterally after the IV injection of indigo carmine dye.  The bladder dome and the trigone were noted to be normal.  The bladder was visualized throughout 360 degrees.  The bladder and the urethra were free of suture material.  DESCRIPTION OF PROCEDURE:     The patient was brought to the operating room, and general endotracheal anesthesia was induced.  I was reintroduced to the patient while she was under general anesthesia, and Dr. Tenny Craw had already made a Pfannenstiel incision and was opening the abdominal wall.  Please refer to the dictation for  the total abdominal hysterectomy with left salpingo-oophorectomy by Dr. Tenny Craw as a separate dictation.  After the peritoneal cavity had been closed, a self-retaining retractor was placed preperitoneally.  The rectus muscles were divided close to their tendinous insertions bilaterally using monopolar cautery.  This improved access to the retropubic space which was noted to be deep within the pelvis. The patient previously had a 30 cc Foley catheter placed inside the urinary bladder which was identified at this time.  The dissection began bilaterally along either side of the urethra into the retropubic space to identify  the arcus tendinous fasciae pelvis.  This was successfully performed.  With a gloved hand placed inside the vagina, the paravaginal tissue was identified bilaterally.  A fuse of four Burch sutures of 0 Ethibond were placed for the Burch procedure.  Two sutures were placed on each side.  The first suture was a figure-of-eight was through the left paravaginal tissue lateral to and at the mid point of the urethra.  The second suture was placed lateral to this and at the level of the urethrovesical junction.  The same procedure that was performed on the right-hand side was then repeated on the left.  Each arm of the suture was then brought up through the Coopers ligament on the ipsilateral side, and the sutures were tied on top of the Coopers ligament while elevating the tissue from below.  This provided good elevation of the urethra.  The Foley catheter was then removed, and transurethral cystoscopy was performed with findings as noted above.  With the bladder full, a suprapubic catheter was placed under visualization of the cystoscope.  The suprapubic catheter was then secured to the skin using nylon suture.  At this point, Dr. Tenny Craw placed a Jackson-Pratt drain in the retropubic space. The rectus muscles were then reattached close to their tendinous insertion sites by placing  figure-of-eight sutures of 0 Vicryl bilaterally.  Hemostasis was noted to be good at this time.  Dr. Tenny Craw then completed the procedure with closure of the abdominal wall.  A sterile bandage was  placed over the incision.  Both the suprapubic catheter and the Foley catheter were placed to gravity drainage, and they were secured to the patient.  The procedure was without complications.  All needle, instrument, and sponge counts were correct. Dictated by:   Devoria Albe Edward Jolly, M.D. Attending Physician:  Miguel Aschoff DD:  08/15/01 TD:  08/18/01 Job: [id number]

## 2010-07-14 NOTE — Op Note (Signed)
[facility]  Patient:    Laura Caldwell, Laura Caldwell                      MRN: [id number] Proc. Date: 09/01/99 Adm. Date:  [id number] Attending:  Miguel Aschoff                           Operative Report  PREOPERATIVE DIAGNOSIS:       Desired sterilization.  POSTOPERATIVE DIAGNOSIS:      Desired sterilization.  OPERATION:                    Laparoscopic tubal using cautery and division.  SURGEON:                      Miguel Aschoff, M.D.  ASSISTANT:  ANESTHESIA:                   General.  COMPLICATIONS:                None.  INDICATIONS: The patient is a 50 year old white female, gravida 4, para 2-0-2-2, requesting permanent sterilization for socioeconomic reasons. The risks and benefits of this procedure have been discussed with the patient and she has given her informed consent for laparoscopic tubal sterilization.  DESCRIPTION OF PROCEDURE: The patient was taken to the operating room and placed in the supine position and general anesthesia was administered without difficulty. She was then placed in the dorsal lithotomy position, prepped and draped in the usual sterile fashion. The bladder was catheterized and a Hulka tenaculum was placed through the cervix and held. An initial attempt was made to insufflate through the cul-de-sac using a Veress needle. However, because of some resistance to flow we elected to stop at this point and use infraumbilical route for insufflation. The attention was then directed to the umbilicus. A small infraumbilical incision was made. A Veress needle was inserted and the abdomen was insufflated with 3 L of CO2 under monitored pressure without difficulty. After this was done, the trocar to laparoscope was placed followed by the laparoscope itself. Inspection revealed the uterus to be anterior, normal size and shape. The cul-de-sac was unremarkable. The round ligaments were unremarkable. The bladder peritoneum was  unremarkable. The ovaries were within normal limits. The tubes were traced along their course and they were totally within normal limits. The fimbria were fine and delicate. On the left side, there was a small paratubal cyst noted. The intestinal surfaces appeared to be within normal limits, as did the liver.  At this point, a bipolar cautery forceps was introduced via the operating channel of the laparoscope and the mid portion of each tube was cauterized for approximately 3 cm. After this was done, scissors were introduced and the tubes were divided with good separation and good hemostasis. At this point, with no other abnormalities being noted, the procedure was completed. The CO2 was allowed to escape. The instruments were removed and then the small incision was closed using subcuticular 4-0 Vicryl. The ports were then injected with 0.25% Marcaine. The estimated blood loss was less than 20 cc. The patient tolerated the procedure well and went to the recovery room in satisfactory condition. DD:  09/01/99 TD:  09/01/99 Job: [id number]

## 2010-07-14 NOTE — Op Note (Signed)
[facility]  Patient:    Laura Caldwell, Laura Caldwell Visit Number: [id number] MRN: [id number]          Service Type: GYN Location: 9300 9310 01 Attending Physician:  Miguel Aschoff Dictated by:   Miguel Aschoff, M.D. Proc. Date: 08/15/01 Admit Date:  08/15/2001 Discharge Date: 08/18/2001                             Operative Report  PREOPERATIVE DIAGNOSES:       1. Dysmenorrhea.                               2. Menorrhagia.                               3. Pelvic pain.                               4. Urinary stress incontinence.  POSTOPERATIVE DIAGNOSES:      1. Dysmenorrhea.                               2. Menorrhagia.                               3. Pelvic pain.                               4. Urinary stress incontinence.  OPERATION:                    Total abdominal hysterectomy and left                               salpingo-oophorectomy.  SURGEON:                      Miguel Aschoff, M.D.  ASSISTANT:                    Brook A. Edward Jolly, M.D.  ANESTHESIA:                   General.  COMPLICATIONS:                None.  INDICATIONS:                  The patient is a 50 year old white female with a history of pelvic pain, menorrhagia, and dysmenorrhea.  The patient has a significant problem with urinary stress incontinence.  She presents now to have these problems resolved with total abdominal hysterectomy, probable left salpingo-oophorectomy, as well as a Burch procedure.  Risks and benefits of the procedure were discussed with the patient and informed consent has been obtained.  DESCRIPTION OF PROCEDURE:     The patient was taken to the operating room and placed in the supine position.  General anesthesia was administered without difficulty.  She was then placed in the modified lithotomy position, prepped and draped in the usual sterile fashion.  A Foley catheter was inserted with a 30 cc balloon.  Once this was done,  a Pfannenstiel incision was  made, and extended down through the subcutaneous tissue with bleeding points being clamped and coagulated as they were encountered.  The fascia was then identified and incised transversely, and separated from the underlying rectus muscles.  The rectus muscles were divided in the midline.  The peritoneum was then identified and entered, carefully to avoid any underlying structures. The self-retaining retractor was then placed through the wound.  The viscera were packed out of the pelvis.  Inspection revealed the uterus to be normal size and shape.  The anterior bladder peritoneum was unremarkable.  The round ligaments were unremarkable.  The tubes were inspected along their course. They appeared to be normal except there is a prior tubal sterilization.  The ovaries were totally within normal limits.  No adhesions were noted.  There was no endometriosis.  At this point, the round ligaments were identified, clamped, cut, and suture ligated using suture ligatures of 0 Vicryl.  Then, a perforation was made below the utero-ovarian ligament on the right and this ligament was clamped, cut, and suture ligated using suture ligatures of 0 Vicryl.  On the left side, the round ligament was clamped, cut, and suture ligated in a similar fashion, and then the space between the infundibulopelvic ligament and round ligament was opened.  The ureter identified and appeared to be clearly out of the field.  The left infundibulopelvic ligament was clamped, cut, and doubly ligated using two suture ligatures of 0 Vicryl.  The broad ligament was then skeletonized.  The uterine vessels were identified, clamped, cut, and suture ligated using suture ligatures of 0 Vicryl.  Then, in a serial fashion, the paracervical fascia, cardinal ligaments, and uterosacral ligaments were all cut, clamped, and suture ligated using suture ligatures of 0 Vicryl.  At this point, it was possible to place _________ clamps across  the reflection of the vagina on to the cervix, and then the specimen cut free. This specimen consisted of the cervix, uterus, left tube and ovary.  Once this was done, these pedicles were suture ligated using suture ligatures of 0 Vicryl and held.  Then, using a 0 Vicryl suture ligature, the cuff was closed.  The uterosacral ligaments were incorporated to provide support to the vagina and the cuff was closed.  Inspection was made for hemostasis. Hemostasis appeared to be excellent.  At this point, lap counts were taken, and found to be correct, and then the parietal peritoneum was closed using running continuous 0 Vicryl suture.  At this point, Dr. Conley Simmonds proceeded to perform a Burch procedure, and this will be dictated as a separate note.  After the Burch procedure was completed, a suprapubic Bonnano catheter was placed.  A Jackson-Pratt drain was placed in the space of Retzius and exited via a stab wound out of the right lower quadrant.  The fascia was then identified and closed using running interlocking 0 Vicryl suture.  Two sutures were used, and each starting at both the lateral fascial angles and meeting in the midline.  The subcutaneous tissue and skin were then closed using staples. The suprapubic Bonnano catheter and drain were then sutured into place using 2-0 nylon suture.  The estimated blood loss for the procedure was approximately 300 cc.  The patient tolerated the procedure well and went to the recovery room in satisfactory condition. Dictated by:   Miguel Aschoff, M.D. Attending Physician:  Miguel Aschoff DD:  08/15/01 TD:  08/18/01 Job: [id number]

## 2010-07-14 NOTE — Op Note (Signed)
[place]  Patient:    Laura Caldwell, Laura Caldwell Visit Number: [id number] MRN: [id number]          Service Type: OBS Location: 9300 9310 01 Attending Physician:  Miguel Aschoff Dictated by:   [id number] Admit Date:  08/15/2001                             Operative Report  NO DICTATION Dictated by:   [id number] Attending Physician:  Miguel Aschoff DD:  08/15/01 TD:  08/18/01 Job: [id number]

## 2010-10-16 ENCOUNTER — Telehealth: Payer: Self-pay | Admitting: *Deleted

## 2010-10-16 NOTE — Telephone Encounter (Signed)
Low back pain with numbness left leg and toes.  Just wants to know if Dr. Tawanna Cooler wants her to take the Prednisone that she has at home???  Would rather not make an office visit.

## 2010-10-16 NOTE — Telephone Encounter (Signed)
Spoke with patient.

## 2010-10-16 NOTE — Telephone Encounter (Signed)
Okay to restart prednisone, and she's previously done.  If, however, we don't see any improvement in her pain gets worse.  Office visit

## 2010-10-17 ENCOUNTER — Ambulatory Visit (INDEPENDENT_AMBULATORY_CARE_PROVIDER_SITE_OTHER): Payer: 59 | Admitting: Family Medicine

## 2010-10-17 ENCOUNTER — Ambulatory Visit (INDEPENDENT_AMBULATORY_CARE_PROVIDER_SITE_OTHER)
Admission: RE | Admit: 2010-10-17 | Discharge: 2010-10-17 | Disposition: A | Discharge status: Home / Self Care | Payer: 59 | Source: Ambulatory Visit | Attending: Family Medicine | Admitting: Family Medicine

## 2010-10-17 ENCOUNTER — Encounter: Payer: Self-pay | Admitting: Family Medicine

## 2010-10-17 DIAGNOSIS — R200 Anesthesia of skin: Secondary | ICD-10-CM

## 2010-10-17 DIAGNOSIS — R209 Unspecified disturbances of skin sensation: Secondary | ICD-10-CM

## 2010-10-17 DIAGNOSIS — M6281 Muscle weakness (generalized): Secondary | ICD-10-CM

## 2010-10-17 NOTE — Progress Notes (Signed)
  Subjective:    Patient ID: Eldena Dede Mandile, female    DOB: 28-Feb-1960, 50 y.o.   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  She's had a history of lumbar disk disease with episodes two to 3 times per year of severe low back pain.  All these episodes have resolved with rest physical therapy, sometimes low dose prednisone.  She had an episode of back pain in July took a short course of prednisone.  It within two weeks.  The pain was gone.  About a week ago she noticed numbness in her left leg.  No history of trauma and now no pain.  Review of systems otherwise negative   Review of Systems    General neurologic review of systems otherwise negative Objective:   Physical Exam  Well-developed female in no acute distress.  Examination of the leg shows it to be of equal length.  Sensation is normal bilaterally.  Muscle strength right leg 5 out of 5 left leg 4 out of 5.  Definite weakness in all groups.  Reflexes symmetrical, except for her knee reflex, left knee is diminished compared with the right.  Toes are downgoing.  Hips are normal.  Peripheral pulses normal.  Skin normal.  Back exam normal.  No palpable tenderness      Assessment & Plan:  History of lumbar disk disease, now with numbness, left leg with decreased muscle strength planned.  Imaging studies

## 2010-10-17 NOTE — Progress Notes (Signed)
Addended by: Kern Reap B on: 10/17/2010 01:12 PM   Modules accepted: Orders

## 2010-10-17 NOTE — Patient Instructions (Signed)
Go to the main office now for x-rays of her spine.  We will call you and you to set up for an MRI and I will call you the report

## 2010-10-18 ENCOUNTER — Telehealth: Payer: Self-pay

## 2010-10-18 NOTE — Telephone Encounter (Signed)
Pt requesting x-ray results. Pt aware that x-ray hasn't been reviewed by md yet but she will receive a call when it's done.  Pt also wants md to know that MRI is scheduled for tomorrow

## 2010-10-19 ENCOUNTER — Telehealth: Payer: Self-pay | Admitting: *Deleted

## 2010-10-19 ENCOUNTER — Ambulatory Visit
Admission: RE | Admit: 2010-10-19 | Discharge: 2010-10-19 | Disposition: A | Discharge status: Home / Self Care | Payer: 59 | Source: Ambulatory Visit | Attending: Family Medicine | Admitting: Family Medicine

## 2010-10-19 DIAGNOSIS — M6281 Muscle weakness (generalized): Secondary | ICD-10-CM

## 2010-10-19 DIAGNOSIS — R200 Anesthesia of skin: Secondary | ICD-10-CM

## 2010-10-19 NOTE — Telephone Encounter (Signed)
Dr Todd spoke with patient 

## 2010-10-19 NOTE — Telephone Encounter (Signed)
Needs x-ray results.

## 2010-10-20 ENCOUNTER — Telehealth: Payer: Self-pay | Admitting: Family Medicine

## 2010-10-20 NOTE — Telephone Encounter (Signed)
Re faxed copy.

## 2010-10-20 NOTE — Telephone Encounter (Signed)
Left message on triage line. Was told by Dr. Tawanna Cooler that we would send her MRI over to Dr. Rosina Lowenstein (sp?) office so she could get an appt there. She called them; they do not have the MRI. Please call her.

## 2010-10-23 ENCOUNTER — Telehealth: Payer: Self-pay | Admitting: *Deleted

## 2010-10-23 NOTE — Telephone Encounter (Signed)
I will in Percocet, number 30 directions one every 4 to 6 hours as needed for severe pain.  No refills.  She is out of her Vicodin and now over the weekend.  Has had severe pain requiring Vicodin every two to 3 hours.  Her appointment is with Darl Pikes at the neurosurgery office tomorrow at 3 p.m.

## 2010-10-23 NOTE — Telephone Encounter (Signed)
Back pain got worse over the weekend, and needs refill on pain meds.  Cannot see neurosurgeon until the end of September, and sees PA tomorrow.  Would like a call back from [facility] or Dr. Tawanna Cooler.

## 2010-11-08 ENCOUNTER — Telehealth: Payer: Self-pay

## 2010-11-08 NOTE — Telephone Encounter (Signed)
Pt called and stated she wanted to know if Dr. Tawanna Cooler would be in 11/09/10 because at her last appt she was told to bring in her FMLA paperwork and he will sign it.Pt wanted to know if tomorrow as a good day to do this.   Pls advise or call pt back.

## 2010-11-09 NOTE — Telephone Encounter (Signed)
Come in today

## 2010-11-09 NOTE — Telephone Encounter (Signed)
patient  Is aware 

## 2010-11-24 ENCOUNTER — Other Ambulatory Visit: Payer: Self-pay | Admitting: *Deleted

## 2010-11-24 MED ORDER — HYDROCODONE-ACETAMINOPHEN 7.5-750 MG PO TABS: 1.0000 | ORAL_TABLET | Freq: Four times a day (QID) | ORAL | Status: AC | PRN

## 2010-12-01 LAB — POCT HEMOGLOBIN-HEMACUE: Hemoglobin: 14.5 g/dL (ref 12.0–15.0)

## 2011-01-04 ENCOUNTER — Telehealth: Payer: Self-pay | Admitting: *Deleted

## 2011-01-04 NOTE — Telephone Encounter (Signed)
patient  Is calling because Zomig is no longer available to her due to insurance denial.  A fax should be sent with available alternatives.  Spoke with patient and samples are available until new rx

## 2011-01-17 ENCOUNTER — Other Ambulatory Visit: Payer: Self-pay | Admitting: *Deleted

## 2011-01-17 MED ORDER — LEVOTHYROXINE SODIUM 50 MCG PO TABS: 50.0000 ug | ORAL_TABLET | Freq: Every day | ORAL | Status: DC

## 2011-02-15 ENCOUNTER — Other Ambulatory Visit (INDEPENDENT_AMBULATORY_CARE_PROVIDER_SITE_OTHER): Payer: 59

## 2011-02-15 ENCOUNTER — Other Ambulatory Visit: Payer: Self-pay | Admitting: Family Medicine

## 2011-02-15 ENCOUNTER — Other Ambulatory Visit: Payer: Self-pay | Admitting: *Deleted

## 2011-02-15 DIAGNOSIS — Z0000 Encounter for general adult medical examination without abnormal findings: Secondary | ICD-10-CM

## 2011-02-15 DIAGNOSIS — E039 Hypothyroidism, unspecified: Secondary | ICD-10-CM

## 2011-02-15 LAB — BASIC METABOLIC PANEL
BUN: 15 mg/dL (ref 6–23)
CO2: 28 mEq/L (ref 19–32)
Calcium: 9.7 mg/dL (ref 8.4–10.5)
Chloride: 106 mEq/L (ref 96–112)
Creatinine, Ser: 1 mg/dL (ref 0.4–1.2)
GFR: 61.12 mL/min (ref >60.00)
Glucose, Bld: 100 mg/dL — ABNORMAL HIGH (ref 70–99)
Potassium: 4.6 mEq/L (ref 3.5–5.1)
Sodium: 143 mEq/L (ref 135–145)

## 2011-02-15 LAB — HEPATIC FUNCTION PANEL
ALT: 68 U/L — ABNORMAL HIGH (ref 0–35)
AST: 39 U/L — ABNORMAL HIGH (ref 0–37)
Albumin: 4.3 g/dL (ref 3.5–5.2)
Alkaline Phosphatase: 108 U/L (ref 39–117)
Bilirubin, Direct: 0.1 mg/dL (ref 0.0–0.3)
Total Bilirubin: 0.6 mg/dL (ref 0.3–1.2)
Total Protein: 7.6 g/dL (ref 6.0–8.3)

## 2011-02-15 LAB — POCT URINALYSIS DIPSTICK
Bilirubin, UA: NEGATIVE
Blood, UA: NEGATIVE
Glucose, UA: NEGATIVE
Ketones, UA: NEGATIVE
Leukocytes, UA: NEGATIVE
Nitrite, UA: NEGATIVE
Protein, UA: NEGATIVE
Spec Grav, UA: 1.015
Urobilinogen, UA: 0.2
pH, UA: 6

## 2011-02-15 LAB — CBC WITH DIFFERENTIAL/PLATELET
Basophils Absolute: 0 10*3/uL (ref 0.0–0.1)
Basophils Relative: 0.8 % (ref 0.0–3.0)
Eosinophils Absolute: 0 10*3/uL (ref 0.0–0.7)
Eosinophils Relative: 1 % (ref 0.0–5.0)
HCT: 40.4 % (ref 36.0–46.0)
Hemoglobin: 14.1 g/dL (ref 12.0–15.0)
Lymphocytes Relative: 23.7 % (ref 12.0–46.0)
Lymphs Abs: 1.1 10*3/uL (ref 0.7–4.0)
MCHC: 35 g/dL (ref 30.0–36.0)
MCV: 92.1 fl (ref 78.0–100.0)
Monocytes Absolute: 0.3 10*3/uL (ref 0.1–1.0)
Monocytes Relative: 5.4 % (ref 3.0–12.0)
Neutro Abs: 3.2 10*3/uL (ref 1.4–7.7)
Neutrophils Relative %: 69.1 % (ref 43.0–77.0)
Platelets: 255 10*3/uL (ref 150.0–400.0)
RBC: 4.38 Mil/uL (ref 3.87–5.11)
RDW: 13.3 % (ref 11.5–14.6)
WBC: 4.7 10*3/uL (ref 4.5–10.5)

## 2011-02-15 LAB — LIPID PANEL
Cholesterol: 304 mg/dL — ABNORMAL HIGH (ref 0–200)
HDL: 70.8 mg/dL (ref >39.00)
Total CHOL/HDL Ratio: 4
Triglycerides: 249 mg/dL — ABNORMAL HIGH (ref 0.0–149.0)
VLDL: 49.8 mg/dL — ABNORMAL HIGH (ref 0.0–40.0)

## 2011-02-15 LAB — LDL CHOLESTEROL, DIRECT: Direct LDL: 193.7 mg/dL

## 2011-02-15 LAB — TSH: TSH: 2.55 u[IU]/mL (ref 0.35–5.50)

## 2011-02-15 MED ORDER — ZOLMITRIPTAN 5 MG PO TBDP: 5.0000 mg | ORAL_TABLET | ORAL | Status: DC | PRN

## 2011-02-15 MED ORDER — HYDROCHLOROTHIAZIDE 25 MG PO TABS: 25.0000 mg | ORAL_TABLET | Freq: Every day | ORAL | Status: DC

## 2011-02-15 MED ORDER — LEVOTHYROXINE SODIUM 50 MCG PO TABS: 50.0000 ug | ORAL_TABLET | Freq: Every day | ORAL | Status: DC

## 2011-02-27 HISTORY — PX: BUNIONECTOMY WITH HAMMERTOE RECONSTRUCTION: SHX5600

## 2011-03-22 ENCOUNTER — Encounter: Payer: 59 | Admitting: Family Medicine

## 2011-04-09 ENCOUNTER — Encounter: Payer: Self-pay | Admitting: Family Medicine

## 2011-04-09 ENCOUNTER — Ambulatory Visit (INDEPENDENT_AMBULATORY_CARE_PROVIDER_SITE_OTHER): Payer: 59 | Admitting: Family Medicine

## 2011-04-09 DIAGNOSIS — E039 Hypothyroidism, unspecified: Secondary | ICD-10-CM

## 2011-04-09 DIAGNOSIS — IMO0002 Reserved for concepts with insufficient information to code with codable children: Secondary | ICD-10-CM

## 2011-04-09 DIAGNOSIS — Z0000 Encounter for general adult medical examination without abnormal findings: Secondary | ICD-10-CM

## 2011-04-09 DIAGNOSIS — I4949 Other premature depolarization: Secondary | ICD-10-CM

## 2011-04-09 DIAGNOSIS — R799 Abnormal finding of blood chemistry, unspecified: Secondary | ICD-10-CM

## 2011-04-09 DIAGNOSIS — R030 Elevated blood-pressure reading, without diagnosis of hypertension: Secondary | ICD-10-CM

## 2011-04-09 DIAGNOSIS — R0789 Other chest pain: Secondary | ICD-10-CM

## 2011-04-09 DIAGNOSIS — Z820 Family history of epilepsy and other diseases of the nervous system: Secondary | ICD-10-CM

## 2011-04-09 MED ORDER — ELETRIPTAN HYDROBROMIDE 20 MG PO TABS: 20.0000 mg | ORAL_TABLET | ORAL | Status: DC | PRN

## 2011-04-09 MED ORDER — HYDROCHLOROTHIAZIDE 25 MG PO TABS: 25.0000 mg | ORAL_TABLET | Freq: Every day | ORAL | Status: DC

## 2011-04-09 MED ORDER — METRONIDAZOLE 1 % EX GEL: Freq: Every day | CUTANEOUS | Status: AC

## 2011-04-09 MED ORDER — LEVOTHYROXINE SODIUM 50 MCG PO TABS: 50.0000 ug | ORAL_TABLET | Freq: Every day | ORAL | Status: DC

## 2011-04-09 NOTE — Progress Notes (Signed)
  Subjective:    Patient ID: Laura Caldwell, female    DOB: Sep 11, 1960, 51 y.o.   MRN: [id number]  HPI Laura Caldwell is a 51 year old married female nonsmoker who comes in today for general physical examination  She has some mild fluid retention and venous insufficiency in her lower extremities for which she takes hydrochlorothiazide 25 mg daily  She takes Synthroid 50 mcg daily for hypothyroidism  She takes Zomig when necessary for migraines however her insurance does not cover that anymore. She thinks they Cover-Relpax.  She gets routine dental care, not routine eye care and she doesn't do a thorough breast exam monthly. Last mammogram was 2 years ago. Recommend BSE monthly and you mammography and eye exams. She never had a colonoscopy again recommended that.  She has bilateral bunions referred to Dr. Toni Arthurs for evaluation  She took a [place] and exercise program. She's lost 10 pounds.  When sitting she'll have some midsternal chest pain. She describes as sometimes sharp sometimes dull and is a 4 on a scale of 1-10 the last for an hour or so and go away. She has no exertional chest pain.  She's had her uterus and one ovary removed for nonmalignant reasons.  Tetanus booster 2011 seasonal flu shot 2012   Review of Systems  Constitutional: Negative.   HENT: Negative.   Eyes: Negative.   Respiratory: Negative.   Cardiovascular: Negative.   Gastrointestinal: Negative.   Genitourinary: Negative.   Musculoskeletal: Positive for back pain.  Neurological: Negative.   Hematological: Negative.   Psychiatric/Behavioral: Negative.        Objective:   Physical Exam  Constitutional: She appears well-developed and well-nourished.  HENT:  Head: Normocephalic and atraumatic.  Right Ear: External ear normal.  Left Ear: External ear normal.  Nose: Nose normal.  Mouth/Throat: Oropharynx is clear and moist.  Eyes: EOM are normal. Pupils are  equal, round, and reactive to light.  Neck: Normal range of motion. Neck supple. No thyromegaly present.  Cardiovascular: Normal rate, regular rhythm, normal heart sounds and intact distal pulses.  Exam reveals no gallop and no friction rub.   No murmur heard. Pulmonary/Chest: Effort normal and breath sounds normal.  Abdominal: Soft. Bowel sounds are normal. She exhibits no distension and no mass. There is no tenderness. There is no rebound.  Genitourinary:       Bilateral breast exam normal  Musculoskeletal: Normal range of motion.  Lymphadenopathy:    She has no cervical adenopathy.  Neurological: She is alert. She has normal reflexes. No cranial nerve deficit. She exhibits normal muscle tone. Coordination normal.  Skin: Skin is warm and dry.  Psychiatric: She has a normal mood and affect. Her behavior is normal. Judgment and thought content normal.          Assessment & Plan:  Healthy female  Overweight continue diet and exercise and weight loss  Fluid retention continue hydrochlorothiazide  Hypothyroidism continue thyroid 50 mcg daily  History of migraine headaches switched to Maxalt or Relpax  Chronic back pain currently asymptomatic recent epidural steroid injections x2 by neurosurgery  History of hysterectomy and one ovary removed.  Recommended yearly eye exam dental exam BSE monthly and you mammography and we'll again refer for screening colonoscopy  Bilateral bunions refer to Dr. Victorino Dike

## 2011-04-09 NOTE — Patient Instructions (Signed)
Continue your diet and exercise program  We will get you set up a GI appointment to begin the colonoscopy process  Call Dr. Toni Arthurs at [facility] orthopedics for evaluation of your feet  Remember to get your eyes checked yearly, regular dental care, BSE monthly, set up her mammogram,  Return in one year sooner if any problems

## 2011-04-30 ENCOUNTER — Telehealth: Payer: Self-pay | Admitting: *Deleted

## 2011-04-30 MED ORDER — PREDNISONE 20 MG PO TABS: 20.0000 mg | ORAL_TABLET | Freq: Every day | ORAL | Status: AC

## 2011-04-30 NOTE — Telephone Encounter (Signed)
Patient is calling because she has some chest congestion with a cough and believes it is her asthma. She would like prednisone called in if possible.  Patient can not come in for an office visit because she is in [place].  Okay to fill?

## 2011-04-30 NOTE — Telephone Encounter (Signed)
Prednisone 20 mg tabs directions 1x3 days,  a half a tab x3 days then a half a tablet Monday Wednesday Friday for a 2 week taper dispense 30 tabs one refill  Hydromet 1/2-1 teaspoon at bedtime when necessary for cough 4 ounces 1 refill

## 2011-05-22 ENCOUNTER — Encounter: Payer: Self-pay | Admitting: Family Medicine

## 2011-05-22 ENCOUNTER — Ambulatory Visit (INDEPENDENT_AMBULATORY_CARE_PROVIDER_SITE_OTHER): Payer: 59 | Admitting: Family Medicine

## 2011-05-22 DIAGNOSIS — J45909 Unspecified asthma, uncomplicated: Secondary | ICD-10-CM

## 2011-05-22 MED ORDER — MONTELUKAST SODIUM 10 MG PO TABS: 10.0000 mg | ORAL_TABLET | Freq: Every day | ORAL | Status: DC

## 2011-05-22 MED ORDER — HYDROCODONE-HOMATROPINE 5-1.5 MG/5ML PO SYRP: 5.0000 mL | ORAL_SOLUTION | Freq: Four times a day (QID) | ORAL | Status: AC | PRN

## 2011-05-22 MED ORDER — ALBUTEROL SULFATE HFA 108 (90 BASE) MCG/ACT IN AERS: 2.0000 | INHALATION_SPRAY | Freq: Four times a day (QID) | RESPIRATORY_TRACT | Status: DC | PRN

## 2011-05-22 NOTE — Patient Instructions (Signed)
Rest at home   drink lots of liquids  60 mg of prednisone now been starting tomorrow morning 40 mg daily  Hydromet 1/2-1 teaspoon 3 times daily when necessary for cough  Singulair 10 mg one daily  Return Thursday afternoon for followup sooner if any problems

## 2011-05-22 NOTE — Progress Notes (Signed)
  Subjective:    Patient ID: Laura Caldwell, female    DOB: 1960-03-24, 52 y.o.   MRN: [id number]  HPI Laura Caldwell is a 51 year old married female nonsmoker who comes in today for evaluation of asthma  About 6 days ago she developed a viral type symptom and yesterday began wheezing. She's had a history of allergic rhinitis and asthma in the past no fever   Review of Systems General pulmonary review of systems otherwise negative    Objective:   Physical Exam Well-developed and nourished female in no acute distress HEENT negative neck was supple no adenopathy lungs show good breath sounds mild to moderate expiratory wheezing diminished by albuterol therapy nebulizer       Assessment & Plan:  Asthma begin prednisone Hydromet lots of liquids albuterol when necessary return in 5 days for followup

## 2011-05-23 ENCOUNTER — Telehealth: Payer: Self-pay

## 2011-05-23 DIAGNOSIS — J45909 Unspecified asthma, uncomplicated: Secondary | ICD-10-CM

## 2011-05-23 MED ORDER — ALBUTEROL SULFATE HFA 108 (90 BASE) MCG/ACT IN AERS: 2.0000 | INHALATION_SPRAY | Freq: Four times a day (QID) | RESPIRATORY_TRACT | Status: AC | PRN

## 2011-05-23 MED ORDER — MONTELUKAST SODIUM 10 MG PO TABS: 10.0000 mg | ORAL_TABLET | Freq: Every day | ORAL | Status: DC

## 2011-05-23 NOTE — Telephone Encounter (Signed)
Pt states that her medications were sent to the wrong pharmacy.  Rx for Singulair and albuterol sent to [facility] on [address].

## 2011-05-24 ENCOUNTER — Ambulatory Visit (INDEPENDENT_AMBULATORY_CARE_PROVIDER_SITE_OTHER): Payer: 59 | Admitting: Family Medicine

## 2011-05-24 ENCOUNTER — Encounter: Payer: Self-pay | Admitting: Family Medicine

## 2011-05-24 VITALS — BP 124/80

## 2011-05-24 DIAGNOSIS — G43909 Migraine, unspecified, not intractable, without status migrainosus: Secondary | ICD-10-CM

## 2011-05-24 DIAGNOSIS — J45909 Unspecified asthma, uncomplicated: Secondary | ICD-10-CM

## 2011-05-24 MED ORDER — RIZATRIPTAN BENZOATE 10 MG PO TBDP: 10.0000 mg | ORAL_TABLET | ORAL | Status: DC | PRN

## 2011-05-24 NOTE — Progress Notes (Signed)
  Subjective:    Patient ID: Laura Caldwell, female    DOB: 03-16-1960, 51 y.o.   MRN: [id number]  HPI Gredmarie is a 51 year old married female nonsmoker who comes in today for followup of asthma  We saw her 3 days ago with a severe flare of her springtime asthma. We started on prednisone and she comes back today for followup she states she feels about 50% better still has significant voice loss   Review of Systems    general and pulmonary view of systems otherwise negative Objective:   Physical Exam  Well-developed well-nourished female in no acute distress HEENT negative neck was supple no adenopathy lungs are clear      Assessment & Plan:  Viral syndrome with secondary asthma improved plan Taper prednisone slowly return when necessary  History of migraine headaches which to Maxalt because of cost

## 2011-05-24 NOTE — Patient Instructions (Signed)
Continue to taper the prednisone slowly,,,,,,,,,, 40 mg x3 days, 20 mg x3 days, 10 mg x3 days, then 10 mg Monday Wednesday Friday for 2-3 week taper

## 2011-05-28 ENCOUNTER — Ambulatory Visit: Payer: 59 | Admitting: Family Medicine

## 2011-06-07 ENCOUNTER — Other Ambulatory Visit: Payer: 59

## 2011-06-19 ENCOUNTER — Encounter: Payer: Self-pay | Admitting: Gastroenterology

## 2011-06-27 ENCOUNTER — Telehealth: Payer: Self-pay | Admitting: Family Medicine

## 2011-06-27 NOTE — Telephone Encounter (Signed)
Opened in error

## 2011-06-29 ENCOUNTER — Other Ambulatory Visit (INDEPENDENT_AMBULATORY_CARE_PROVIDER_SITE_OTHER): Payer: 59

## 2011-06-29 DIAGNOSIS — R799 Abnormal finding of blood chemistry, unspecified: Secondary | ICD-10-CM

## 2011-06-29 DIAGNOSIS — IMO0002 Reserved for concepts with insufficient information to code with codable children: Secondary | ICD-10-CM

## 2011-06-29 LAB — LIPID PANEL
Cholesterol: 235 mg/dL — ABNORMAL HIGH (ref 0–200)
HDL: 67.9 mg/dL (ref >39.00)
Triglycerides: 125 mg/dL (ref 0.0–149.0)

## 2011-06-29 LAB — HEPATIC FUNCTION PANEL
ALT: 27 U/L (ref 0–35)
Albumin: 4.7 g/dL (ref 3.5–5.2)
Total Protein: 7.7 g/dL (ref 6.0–8.3)

## 2011-06-29 LAB — LDL CHOLESTEROL, DIRECT: Direct LDL: 162.5 mg/dL

## 2011-07-02 ENCOUNTER — Other Ambulatory Visit: Payer: 59

## 2011-07-05 ENCOUNTER — Telehealth: Payer: Self-pay | Admitting: *Deleted

## 2011-07-05 NOTE — Telephone Encounter (Signed)
This is a dr. Tawanna Cooler pt

## 2011-07-05 NOTE — Telephone Encounter (Signed)
Please call/notify patient that lab/test/procedure is normal Choleterol down to 235- much improved-offer to send copy of lab

## 2011-07-05 NOTE — Telephone Encounter (Signed)
Left message on machine for patient with lab results 

## 2011-07-05 NOTE — Telephone Encounter (Signed)
Left message to call us all back from her original note.

## 2011-07-05 NOTE — Telephone Encounter (Signed)
Is asking for results from lab work ago.

## 2011-11-29 ENCOUNTER — Telehealth: Payer: Self-pay | Admitting: Family Medicine

## 2011-11-29 NOTE — Telephone Encounter (Signed)
Caller: Alanny/Patient; Phone: [phone]; Reason for Call: Need Dr. Tawanna Cooler to call back for a recomendation for a foot Doctor. Needing this as soon as possible. Sent Per [employer], CSR

## 2011-11-29 NOTE — Telephone Encounter (Signed)
Fleet Contras please call 1,,,,,,,,,,, I recommend Dr. Toni Arthurs with creams for orthopedics

## 2011-11-29 NOTE — Telephone Encounter (Signed)
Spoke with [place]

## 2012-04-13 ENCOUNTER — Other Ambulatory Visit: Payer: Self-pay | Admitting: Family Medicine

## 2012-10-28 ENCOUNTER — Other Ambulatory Visit: Payer: Self-pay | Admitting: Family Medicine

## 2013-01-01 ENCOUNTER — Ambulatory Visit (INDEPENDENT_AMBULATORY_CARE_PROVIDER_SITE_OTHER): Payer: 59 | Admitting: Family Medicine

## 2013-01-01 ENCOUNTER — Encounter: Payer: Self-pay | Admitting: Family Medicine

## 2013-01-01 VITALS — BP 120/90 | Temp 98.8°F

## 2013-01-01 DIAGNOSIS — Z820 Family history of epilepsy and other diseases of the nervous system: Secondary | ICD-10-CM

## 2013-01-01 DIAGNOSIS — G43909 Migraine, unspecified, not intractable, without status migrainosus: Secondary | ICD-10-CM

## 2013-01-01 DIAGNOSIS — Z0000 Encounter for general adult medical examination without abnormal findings: Secondary | ICD-10-CM

## 2013-01-01 DIAGNOSIS — I872 Venous insufficiency (chronic) (peripheral): Secondary | ICD-10-CM

## 2013-01-01 DIAGNOSIS — E039 Hypothyroidism, unspecified: Secondary | ICD-10-CM | POA: Insufficient documentation

## 2013-01-01 DIAGNOSIS — Z23 Encounter for immunization: Secondary | ICD-10-CM

## 2013-01-01 MED ORDER — TRIAMTERENE-HCTZ 37.5-25 MG PO TABS: 1.0000 | ORAL_TABLET | Freq: Every day | ORAL | Status: DC

## 2013-01-01 MED ORDER — RIZATRIPTAN BENZOATE 10 MG PO TBDP: 10.0000 mg | ORAL_TABLET | ORAL | Status: DC | PRN

## 2013-01-01 MED ORDER — LEVOTHYROXINE SODIUM 50 MCG PO TABS: ORAL_TABLET | ORAL | Status: DC

## 2013-01-01 NOTE — Patient Instructions (Signed)
Return sometime in the next week for fasting labs  Continue current medications for now  Stop the hydrochlorothiazide and start Maxide 1 daily to help decrease the fluid retention of U. legs  Walk 30 minutes daily  Call and get set up for mammogram  We will put you in the q. for a screening colonoscopy  Call you GYN when you are ready for that

## 2013-01-01 NOTE — Progress Notes (Signed)
  Subjective:    Patient ID: Laura Caldwell, female    DOB: Oct 11, 1960, 52 y.o.   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 If she takes it longer than that she gets leg cramps. Will switch to [facility]  She also takes an aspirin tablet daily.  She gets routine eye care refer to Dr. Vonna Kotyk, regular dental care, BSE occasionally, last mammogram 2 years ago, never had a colonoscopy.  She's had bilateral bunionectomies by Dr. Toni Arthurs.   Review of Systems  Constitutional: Negative.   HENT: Negative.   Eyes: Negative.   Respiratory: Negative.   Cardiovascular: Negative.   Gastrointestinal: Negative.   Endocrine: Negative.   Genitourinary: Negative.   Musculoskeletal: Negative.   Allergic/Immunologic: Negative.   Neurological: Negative.   Hematological: Negative.   Psychiatric/Behavioral: Negative.        Objective:   Physical Exam  Nursing note and vitals reviewed. Constitutional: She appears well-developed and well-nourished.  HENT:  Head: Normocephalic and atraumatic.  Right Ear: External ear normal.  Left Ear: External ear normal.  Nose: Nose normal.  Mouth/Throat: Oropharynx is clear and moist.  Eyes: EOM are normal. Pupils are equal, round, and reactive to light.  Neck: Normal range of motion. Neck supple. No thyromegaly present.  Cardiovascular: Normal rate, regular rhythm, normal heart sounds and intact distal pulses.  Exam reveals no gallop and no friction rub.   No murmur heard. Pulmonary/Chest: Effort normal and breath sounds normal.  Abdominal: Soft. Bowel sounds are normal. She exhibits no distension and no mass. There is no tenderness.  There is no rebound.  Genitourinary:  Bilateral breast exam normal  Musculoskeletal: Normal range of motion.  Scars right and left foot from previous foot surgery  Lymphadenopathy:    She has no cervical adenopathy.  Neurological: She is alert. She has normal reflexes. No cranial nerve deficit. She exhibits normal muscle tone. Coordination normal.  Skin: Skin is warm and dry.  Total body skin exam normal  Psychiatric: She has a normal mood and affect. Her behavior is normal. Judgment and thought content normal.          Assessment & Plan:  Healthy female  Migraine headaches continue Maxalt when necessary  Fluid retention Maxide daily  Hypothyroidism continue Synthroid 50 mcg daily check TSH level

## 2013-01-02 ENCOUNTER — Encounter: Payer: Self-pay | Admitting: Internal Medicine

## 2013-01-08 ENCOUNTER — Other Ambulatory Visit (INDEPENDENT_AMBULATORY_CARE_PROVIDER_SITE_OTHER): Payer: 59

## 2013-01-08 DIAGNOSIS — E039 Hypothyroidism, unspecified: Secondary | ICD-10-CM

## 2013-01-08 DIAGNOSIS — E785 Hyperlipidemia, unspecified: Secondary | ICD-10-CM

## 2013-01-08 DIAGNOSIS — Z0000 Encounter for general adult medical examination without abnormal findings: Secondary | ICD-10-CM

## 2013-01-08 LAB — POCT URINALYSIS DIPSTICK
Bilirubin, UA: NEGATIVE
Glucose, UA: NEGATIVE
Ketones, UA: NEGATIVE
Protein, UA: NEGATIVE
Spec Grav, UA: 1.02

## 2013-01-08 LAB — CBC WITH DIFFERENTIAL/PLATELET
Basophils Absolute: 0 10*3/uL (ref 0.0–0.1)
Basophils Relative: 0.9 % (ref 0.0–3.0)
Eosinophils Absolute: 0 10*3/uL (ref 0.0–0.7)
Eosinophils Relative: 0.8 % (ref 0.0–5.0)
Hemoglobin: 14.3 g/dL (ref 12.0–15.0)
Lymphs Abs: 1.3 10*3/uL (ref 0.7–4.0)
MCV: 88.4 fl (ref 78.0–100.0)
Monocytes Absolute: 0.3 10*3/uL (ref 0.1–1.0)
Monocytes Relative: 5.3 % (ref 3.0–12.0)
Neutro Abs: 3.1 10*3/uL (ref 1.4–7.7)
Neutrophils Relative %: 65.4 % (ref 43.0–77.0)
Platelets: 279 10*3/uL (ref 150.0–400.0)
RDW: 13.4 % (ref 11.5–14.6)
WBC: 4.7 10*3/uL (ref 4.5–10.5)

## 2013-01-08 LAB — HEPATIC FUNCTION PANEL
ALT: 40 U/L — ABNORMAL HIGH (ref 0–35)
AST: 32 U/L (ref 0–37)
Albumin: 4.5 g/dL (ref 3.5–5.2)
Alkaline Phosphatase: 110 U/L (ref 39–117)
Total Bilirubin: 0.7 mg/dL (ref 0.3–1.2)

## 2013-01-08 LAB — BASIC METABOLIC PANEL
CO2: 29 mEq/L (ref 19–32)
Chloride: 100 mEq/L (ref 96–112)
Creatinine, Ser: 0.9 mg/dL (ref 0.4–1.2)
Glucose, Bld: 102 mg/dL — ABNORMAL HIGH (ref 70–99)
Potassium: 3.8 mEq/L (ref 3.5–5.1)

## 2013-01-08 LAB — LIPID PANEL
Cholesterol: 292 mg/dL — ABNORMAL HIGH (ref 0–200)
Triglycerides: 255 mg/dL — ABNORMAL HIGH (ref 0.0–149.0)

## 2013-01-08 LAB — TSH: TSH: 2.68 u[IU]/mL (ref 0.35–5.50)

## 2013-01-08 LAB — LDL CHOLESTEROL, DIRECT: Direct LDL: 203.2 mg/dL

## 2013-01-12 NOTE — Addendum Note (Signed)
Addended by: Kern Reap B on: 01/12/2013 02:42 PM   Modules accepted: Orders

## 2013-02-27 ENCOUNTER — Other Ambulatory Visit: Payer: Self-pay | Admitting: Family Medicine

## 2013-03-02 ENCOUNTER — Ambulatory Visit (AMBULATORY_SURGERY_CENTER): Payer: Self-pay | Admitting: *Deleted

## 2013-03-02 VITALS — Ht 66.0 in | Wt 248.4 lb

## 2013-03-02 DIAGNOSIS — Z1211 Encounter for screening for malignant neoplasm of colon: Secondary | ICD-10-CM

## 2013-03-02 MED ORDER — NA SULFATE-K SULFATE-MG SULF 17.5-3.13-1.6 GM/177ML PO SOLN: 1.0000 | Freq: Once | ORAL | Status: DC

## 2013-03-02 NOTE — Progress Notes (Signed)
No egg or soy allergy. No anesthesia problems.  

## 2013-03-04 ENCOUNTER — Encounter: Payer: Self-pay | Admitting: Family Medicine

## 2013-03-16 ENCOUNTER — Encounter: Payer: Self-pay | Admitting: Internal Medicine

## 2013-03-16 ENCOUNTER — Ambulatory Visit (AMBULATORY_SURGERY_CENTER): Payer: 59 | Admitting: Internal Medicine

## 2013-03-16 VITALS — BP 135/78 | HR 62 | Temp 98.3°F | Resp 17 | Ht 66.0 in | Wt 248.0 lb

## 2013-03-16 DIAGNOSIS — Z1211 Encounter for screening for malignant neoplasm of colon: Secondary | ICD-10-CM

## 2013-03-16 MED ORDER — SODIUM CHLORIDE 0.9 % IV SOLN: 500.0000 mL | INTRAVENOUS | Status: DC

## 2013-03-16 NOTE — Progress Notes (Signed)
No complaints noted in the recovery room. Maw   

## 2013-03-16 NOTE — Patient Instructions (Addendum)
Your colonoscopy was normal and the prep was great.  Next routine colonoscopy in 10 years - 2025  I appreciate the opportunity to care for you. Gatha Mayer, MD, FACG    YOU HAD AN ENDOSCOPIC PROCEDURE TODAY AT [facility]: Refer to the procedure report that was given to you for any specific questions about what was found during the examination.  If the procedure report does not answer your questions, please call your gastroenterologist to clarify.  If you requested that your care partner not be given the details of your procedure findings, then the procedure report has been included in a sealed envelope for you to review at your convenience later.  YOU SHOULD EXPECT: Some feelings of bloating in the abdomen. Passage of more gas than usual.  Walking can help get rid of the air that was put into your GI tract during the procedure and reduce the bloating. If you had a lower endoscopy (such as a colonoscopy or flexible sigmoidoscopy) you may notice spotting of blood in your stool or on the toilet paper. If you underwent a bowel prep for your procedure, then you may not have a normal bowel movement for a few days.  DIET: Your first meal following the procedure should be a light meal and then it is ok to progress to your normal diet.  A half-sandwich or bowl of soup is an example of a good first meal.  Heavy or fried foods are harder to digest and may make you feel nauseous or bloated.  Likewise meals heavy in dairy and vegetables can cause extra gas to form and this can also increase the bloating.  Drink plenty of fluids but you should avoid alcoholic beverages for 24 hours.  ACTIVITY: Your care partner should take you home directly after the procedure.  You should plan to take it easy, moving slowly for the rest of the day.  You can resume normal activity the day after the procedure however you should NOT DRIVE or use heavy machinery for 24 hours (because of the sedation medicines  used during the test).    SYMPTOMS TO REPORT IMMEDIATELY: A gastroenterologist can be reached at any hour.  During normal business hours, 8:30 AM to 5:00 PM Monday through Friday, call [phone].  After hours and on weekends, please call the GI answering service at [phone] who will take a message and have the physician on call contact you.   Following lower endoscopy (colonoscopy or flexible sigmoidoscopy):  Excessive amounts of blood in the stool  Significant tenderness or worsening of abdominal pains  Swelling of the abdomen that is new, acute  Fever of 100F or higher   FOLLOW UP: If any biopsies were taken you will be contacted by phone or by letter within the next 1-3 weeks.  Call your gastroenterologist if you have not heard about the biopsies in 3 weeks.  Our staff will call the home number listed on your records the next business day following your procedure to check on you and address any questions or concerns that you may have at that time regarding the information given to you following your procedure. This is a courtesy call and so if there is no answer at the home number and we have not heard from you through the emergency physician on call, we will assume that you have returned to your regular daily activities without incident.  SIGNATURES/CONFIDENTIALITY: You and/or your care partner have signed paperwork which will be entered  into your electronic medical record.  These signatures attest to the fact that that the information above on your After Visit Summary has been reviewed and is understood.  Full responsibility of the confidentiality of this discharge information lies with you and/or your care-partner.    You may resume your current medications today. Please call if any questions or concerns.

## 2013-03-16 NOTE — Op Note (Signed)
[place]  [address] [place], [address]   COLONOSCOPY PROCEDURE REPORT  PATIENT: Laura, Caldwell  MR#: [id number] BIRTHDATE: [birth year] , 59  yrs. old GENDER: Female ENDOSCOPIST: Gatha Mayer, MD, [facility] REFERRED MY:[id number] Delora Fuel, M.D. PROCEDURE DATE:  03/16/2013 PROCEDURE:   Colonoscopy, screening First Screening Colonoscopy - Avg.  risk and is 50 yrs.  old or older Yes.  Prior Negative Screening - Now for repeat screening. N/A  History of Adenoma - Now for follow-up colonoscopy & has been > or = to 3 yrs.  N/A  Polyps Removed Today? No.  Recommend repeat exam, <10 yrs? No. ASA CLASS:   Class II INDICATIONS:average risk screening and first colonoscopy. MEDICATIONS: propofol (Diprivan) 250mg  IV, MAC sedation, administered by CRNA, and These medications were titrated to patient response per physician's verbal order  DESCRIPTION OF PROCEDURE:   After the risks benefits and alternatives of the procedure were thoroughly explained, informed consent was obtained.  A digital rectal exam revealed no abnormalities of the rectum.   The [id number]  endoscope was introduced through the anus and advanced to the cecum, which was identified by both the appendix and ileocecal valve. No adverse events experienced.   The quality of the prep was excellent using Suprep  The instrument was then slowly withdrawn as the colon was fully examined.      COLON FINDINGS: A normal appearing cecum, ileocecal valve, and appendiceal orifice were identified.  The ascending, hepatic flexure, transverse, splenic flexure, descending, sigmoid colon and rectum appeared unremarkable.  No polyps or cancers were seen.   A right colon retroflexion was performed.  Retroflexed views revealed no abnormalities. The time to cecum=1 minutes 22 seconds. Withdrawal time=6 minutes 50 seconds.  The scope was withdrawn and the procedure completed. COMPLICATIONS: There were no  complications.  ENDOSCOPIC IMPRESSION: Normal colonoscopy - excellent prep - first colonoscopy  RECOMMENDATIONS: Repeat colonoscopy 10 years - 2025   eSigned:  Gatha Mayer, MD, [facility] 03/16/2013 9:00 AM   cc: Dorena Cookey, MD and The Patient

## 2013-03-16 NOTE — Progress Notes (Signed)
A/ox3 pleased with MAC, report to Annette RN 

## 2013-03-17 ENCOUNTER — Telehealth: Payer: Self-pay | Admitting: *Deleted

## 2013-03-17 NOTE — Telephone Encounter (Signed)
  Follow up Call-  Call back number 03/16/2013  Post procedure Call Back phone  # [phone]  Permission to leave phone message Yes     Patient questions:  Do you have a fever, pain , or abdominal swelling? no Pain Score  0 *  Have you tolerated food without any problems? yes  Have you been able to return to your normal activities? yes  Do you have any questions about your discharge instructions: Diet   no Medications  no Follow up visit  no  Do you have questions or concerns about your Care? no  Actions: * If pain score is 4 or above: No action needed, pain <4.

## 2013-06-01 ENCOUNTER — Ambulatory Visit (INDEPENDENT_AMBULATORY_CARE_PROVIDER_SITE_OTHER): Payer: 59 | Admitting: *Deleted

## 2013-06-01 DIAGNOSIS — Z0000 Encounter for general adult medical examination without abnormal findings: Secondary | ICD-10-CM

## 2013-06-04 LAB — TB SKIN TEST
INDURATION: 0 mm
TB Skin Test: NEGATIVE

## 2013-06-15 ENCOUNTER — Telehealth: Payer: Self-pay | Admitting: *Deleted

## 2013-06-15 MED ORDER — PREDNISONE 20 MG PO TABS
20.0000 mg | ORAL_TABLET | Freq: Every day | ORAL | Status: DC
Start: 2013-06-15 — End: 2014-01-28

## 2013-06-15 MED ORDER — TRAMADOL HCL 50 MG PO TABS: 50.0000 mg | ORAL_TABLET | Freq: Three times a day (TID) | ORAL | Status: DC | PRN

## 2013-06-15 NOTE — Telephone Encounter (Signed)
Patient is calling because she is having back pain.  She has a history of back pain and would like to know if prednisone and pain medication can be called in.  She can not come into the office because she has started her new job in [employer].

## 2013-06-15 NOTE — Telephone Encounter (Signed)
Rx sent/ called into pharmacy per Dr Sherren Mocha.  Patient is aware

## 2013-08-13 ENCOUNTER — Other Ambulatory Visit: Payer: Self-pay | Admitting: Family Medicine

## 2013-12-07 ENCOUNTER — Telehealth: Payer: Self-pay | Admitting: Family Medicine

## 2013-12-07 NOTE — Telephone Encounter (Signed)
Pt  Has pink eye in both eyes and would like something call [facility] [place]

## 2013-12-07 NOTE — Telephone Encounter (Signed)
Per Dr Sherren Mocha use clear eyes OTC 1 drop qid

## 2013-12-07 NOTE — Telephone Encounter (Signed)
Pt aware.

## 2013-12-15 ENCOUNTER — Encounter: Payer: Self-pay | Admitting: Family Medicine

## 2013-12-15 ENCOUNTER — Other Ambulatory Visit: Payer: Self-pay | Admitting: Family Medicine

## 2013-12-15 ENCOUNTER — Ambulatory Visit (INDEPENDENT_AMBULATORY_CARE_PROVIDER_SITE_OTHER): Payer: 59 | Admitting: Family Medicine

## 2013-12-15 VITALS — BP 120/84 | Temp 98.1°F

## 2013-12-15 DIAGNOSIS — J309 Allergic rhinitis, unspecified: Secondary | ICD-10-CM | POA: Insufficient documentation

## 2013-12-15 DIAGNOSIS — J301 Allergic rhinitis due to pollen: Secondary | ICD-10-CM

## 2013-12-15 MED ORDER — FLUTICASONE PROPIONATE 50 MCG/ACT NA SUSP: 2.0000 | Freq: Every day | NASAL | Status: DC

## 2013-12-15 MED ORDER — AMOXICILLIN 875 MG PO TABS: 875.0000 mg | ORAL_TABLET | Freq: Two times a day (BID) | ORAL | Status: DC

## 2013-12-15 NOTE — Progress Notes (Signed)
Pre visit review using our clinic review tool, if applicable. No additional management support is needed unless otherwise documented below in the visit note. 

## 2013-12-15 NOTE — Progress Notes (Signed)
   Subjective:    Patient ID: Laura Caldwell, female    DOB: 08/25/1960, 53 y.o.   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 She does not feel like she is wheezing today   Review of Systems Review of systems otherwise negative    Objective:   Physical Exam Well-developed well-nourished female no acute distress vital signs stable she's afebrile HEENT negative except for bilateral cerumen impactions which were relieved by urination. Neck was supple  Nasal passages........ left normal.......Marland Kitchen right totally occluded       Assessment & Plan:  Allergic rhinitis with maxillary facial pain........... try initially with prednisone steroid nasal spray etc. to relieve the edema is............ if not amoxicillin

## 2013-12-15 NOTE — Patient Instructions (Signed)
Afrin nasal spray.........Marland Kitchen 1 shot up each nostril twice daily........... 5 days limit  After the Afrin use 1 shot of a steroid nasal spray up each nostril  Prednisone 20 mg.......... 2 for 3 days........Marland Kitchen 1 for 3 days...... a half for 3 days then stop  If this does not work add the amoxicillin.......... 500 mg........ 3  times daily for 10 days.

## 2014-01-15 ENCOUNTER — Other Ambulatory Visit (INDEPENDENT_AMBULATORY_CARE_PROVIDER_SITE_OTHER): Payer: 59

## 2014-01-15 DIAGNOSIS — Z0000 Encounter for general adult medical examination without abnormal findings: Secondary | ICD-10-CM

## 2014-01-15 DIAGNOSIS — R790 Abnormal level of blood mineral: Secondary | ICD-10-CM

## 2014-01-15 LAB — CBC WITH DIFFERENTIAL/PLATELET
BASOS PCT: 0.6 % (ref 0.0–3.0)
Basophils Absolute: 0 10*3/uL (ref 0.0–0.1)
EOS ABS: 0.1 10*3/uL (ref 0.0–0.7)
EOS PCT: 1.5 % (ref 0.0–5.0)
HEMATOCRIT: 41.9 % (ref 36.0–46.0)
Hemoglobin: 14 g/dL (ref 12.0–15.0)
LYMPHS ABS: 1.2 10*3/uL (ref 0.7–4.0)
Lymphocytes Relative: 26.8 % (ref 12.0–46.0)
MCHC: 33.4 g/dL (ref 30.0–36.0)
MCV: 91.4 fl (ref 78.0–100.0)
MONO ABS: 0.3 10*3/uL (ref 0.1–1.0)
Monocytes Relative: 5.8 % (ref 3.0–12.0)
Neutro Abs: 3 10*3/uL (ref 1.4–7.7)
Neutrophils Relative %: 65.3 % (ref 43.0–77.0)
Platelets: 280 10*3/uL (ref 150.0–400.0)
RBC: 4.58 Mil/uL (ref 3.87–5.11)
RDW: 13.8 % (ref 11.5–15.5)
WBC: 4.6 10*3/uL (ref 4.0–10.5)

## 2014-01-15 LAB — POCT URINALYSIS DIPSTICK
Bilirubin, UA: NEGATIVE
Blood, UA: NEGATIVE
Glucose, UA: NEGATIVE
Ketones, UA: NEGATIVE
Nitrite, UA: NEGATIVE
Protein, UA: NEGATIVE
Spec Grav, UA: 1.015
Urobilinogen, UA: 0.2
pH, UA: 5.5

## 2014-01-15 LAB — BASIC METABOLIC PANEL
BUN: 15 mg/dL (ref 6–23)
CHLORIDE: 106 meq/L (ref 96–112)
CO2: 28 mEq/L (ref 19–32)
Calcium: 9.8 mg/dL (ref 8.4–10.5)
Creatinine, Ser: 1 mg/dL (ref 0.4–1.2)
GFR: 59.77 mL/min — ABNORMAL LOW (ref >60.00)
Glucose, Bld: 97 mg/dL (ref 70–99)
POTASSIUM: 4.6 meq/L (ref 3.5–5.1)
Sodium: 142 mEq/L (ref 135–145)

## 2014-01-15 LAB — LIPID PANEL
CHOL/HDL RATIO: 5
Cholesterol: 315 mg/dL — ABNORMAL HIGH (ref 0–200)
HDL: 60.5 mg/dL (ref >39.00)
NONHDL: 254.5
TRIGLYCERIDES: 413 mg/dL — AB (ref 0.0–149.0)
VLDL: 82.6 mg/dL — AB (ref 0.0–40.0)

## 2014-01-15 LAB — HEPATIC FUNCTION PANEL
ALBUMIN: 4.3 g/dL (ref 3.5–5.2)
ALT: 94 U/L — AB (ref 0–35)
AST: 53 U/L — ABNORMAL HIGH (ref 0–37)
Alkaline Phosphatase: 122 U/L — ABNORMAL HIGH (ref 39–117)
Bilirubin, Direct: 0 mg/dL (ref 0.0–0.3)
TOTAL PROTEIN: 7.5 g/dL (ref 6.0–8.3)
Total Bilirubin: 0.7 mg/dL (ref 0.2–1.2)

## 2014-01-15 LAB — TSH: TSH: 4.1 u[IU]/mL (ref 0.35–4.50)

## 2014-01-15 LAB — LDL CHOLESTEROL, DIRECT: LDL DIRECT: 203.3 mg/dL

## 2014-01-28 ENCOUNTER — Encounter: Payer: Self-pay | Admitting: Family Medicine

## 2014-01-28 ENCOUNTER — Ambulatory Visit (INDEPENDENT_AMBULATORY_CARE_PROVIDER_SITE_OTHER): Payer: 59 | Admitting: Family Medicine

## 2014-01-28 VITALS — BP 110/80 | Temp 98.9°F

## 2014-01-28 DIAGNOSIS — N951 Menopausal and female climacteric states: Secondary | ICD-10-CM

## 2014-01-28 DIAGNOSIS — E039 Hypothyroidism, unspecified: Secondary | ICD-10-CM

## 2014-01-28 DIAGNOSIS — Z820 Family history of epilepsy and other diseases of the nervous system: Secondary | ICD-10-CM

## 2014-01-28 DIAGNOSIS — R195 Other fecal abnormalities: Secondary | ICD-10-CM | POA: Insufficient documentation

## 2014-01-28 DIAGNOSIS — Z23 Encounter for immunization: Secondary | ICD-10-CM

## 2014-01-28 DIAGNOSIS — M1990 Unspecified osteoarthritis, unspecified site: Secondary | ICD-10-CM

## 2014-01-28 DIAGNOSIS — R197 Diarrhea, unspecified: Secondary | ICD-10-CM

## 2014-01-28 DIAGNOSIS — G43109 Migraine with aura, not intractable, without status migrainosus: Secondary | ICD-10-CM

## 2014-01-28 DIAGNOSIS — I872 Venous insufficiency (chronic) (peripheral): Secondary | ICD-10-CM

## 2014-01-28 MED ORDER — ESTROGENS CONJUGATED 0.3 MG PO TABS: 0.3000 mg | ORAL_TABLET | Freq: Every day | ORAL | Status: DC

## 2014-01-28 MED ORDER — TRIAMTERENE-HCTZ 37.5-25 MG PO TABS: 1.0000 | ORAL_TABLET | Freq: Every day | ORAL | Status: DC

## 2014-01-28 MED ORDER — PREDNISONE 20 MG PO TABS
ORAL_TABLET | ORAL | Status: DC
Start: 2014-01-28 — End: 2014-09-01

## 2014-01-28 MED ORDER — TRAMADOL HCL 50 MG PO TABS: 50.0000 mg | ORAL_TABLET | Freq: Three times a day (TID) | ORAL | Status: DC | PRN

## 2014-01-28 MED ORDER — RIZATRIPTAN BENZOATE 10 MG PO TBDP: 10.0000 mg | ORAL_TABLET | ORAL | Status: DC | PRN

## 2014-01-28 MED ORDER — LEVOTHYROXINE SODIUM 50 MCG PO TABS: 50.0000 ug | ORAL_TABLET | Freq: Every day | ORAL | Status: DC

## 2014-01-28 NOTE — Patient Instructions (Signed)
Consider joining Weight [employer]  Premarin 0.3........ one tablet daily at bedtime....... follow-up in 2 months  Lactose free diet for 2 weeks  Continue Synthroid 1 daily  Maxalt when necessary for migraine headaches  Prednisone if you get in the cluster migraine mode  Aleve........ one twice daily with food  Call and get set up for your mammogram at the breast center

## 2014-01-28 NOTE — Progress Notes (Signed)
Subjective:    Patient ID: Laura Caldwell, female    DOB: April 05, 1960, 53 y.o.   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 Who comes in today for general physical examination  She has a history of being overweight and declined to get weighed today. We've talked about the weight watchers options.  She states for the past couple years she's had off and on loose bowel movements sometimes for a day. No fever chills abdominal cramping or constipation. She had a colonoscopy January 2015 which was normal. We discussed the possibility of lactase deficiency and other etiologies  She has a history of arthritis autoimmune etiology unknown and takes over-the-counter anti-inflammatories when necessary. She says Motrin seems to make it worse.  She has a history of venous insufficiency and takes Maxide 30 7. 5-25 daily when necessary  She has a history of hypothyroidism and takes Synthroid 50 g daily  Her migraine headaches of decreased over time she takes when necessary Maxalt. She also takes tramadol when necessary for chronic back pain. She has a history of degenerative disc disease.  She's also have an difficulty with postmenopausal symptoms dry skin hot flushes dry vagina and sleep dysfunction. She had her uterus and ovaries removed at age 36. One ovary was left intact. She's never had any GYN problems.  Flu shot given today  Review of Systems  Constitutional: Negative.   HENT: Negative.   Eyes: Negative.   Respiratory: Negative.   Cardiovascular: Negative.   Gastrointestinal: Negative.   Endocrine: Negative.   Genitourinary: Negative.   Musculoskeletal: Negative.   Skin: Negative.   Allergic/Immunologic: Negative.   Neurological: Negative.   Hematological: Negative.   Psychiatric/Behavioral: Negative.        Objective:   Physical Exam  Constitutional: She is oriented to person, place, and time. She  appears well-developed and well-nourished.  HENT:  Head: Normocephalic and atraumatic.  Right Ear: External ear normal.  Left Ear: External ear normal.  Nose: Nose normal.  Mouth/Throat: Oropharynx is clear and moist.  Eyes: EOM are normal. Pupils are equal, round, and reactive to light.  Neck: Normal range of motion. Neck supple. No JVD present. No tracheal deviation present. No thyromegaly present.  Cardiovascular: Normal rate, regular rhythm, normal heart sounds and intact distal pulses.  Exam reveals no gallop and no friction rub.   No murmur heard. Pulmonary/Chest: Effort normal and breath sounds normal. No stridor. No respiratory distress. She has no wheezes. She has no rales. She exhibits no tenderness.  Abdominal: Soft. Bowel sounds are normal. She exhibits no distension and no mass. There is no tenderness. There is no rebound and no guarding.  Genitourinary:  Pelvic exam not indicated she had her uterus and one ovary removed for nonmalignant reasons.  Rectal deferred normal colonoscopy 2015 January  Musculoskeletal: Normal range of motion.  Lymphadenopathy:    She has no cervical adenopathy.  Neurological: She is alert and oriented to person, place, and time. She has normal reflexes. No cranial nerve deficit. She exhibits normal muscle tone. Coordination normal.  Skin: Skin is warm and dry. No rash noted. No erythema. No pallor.  Total body skin exam normal  Psychiatric: She has a normal mood and affect. Her behavior is normal. Judgment and thought content normal.          Assessment & Plan:  Healthy female  Hypothyroidism continue Synthroid  Migraine headaches Maxalt when necessary prednisone  if she gets back into the cluster migraine mode  Overweight........... diet exercise weight watchers  Loose bowel movements..... Question etiology.......... two-week trial of no lactose ,,,,,, call me in 2 weeks with a progress report  Postmenopausal symptoms ,,,,,,,, trial of  Premarin 0.3 daily at bedtime follow-up in 3 months  Chronic recurrent back pain .......... again discussed the importance of diet exercise weight loss and tramadol when necessary  Autoimmune arthritis ..........Marland Kitchen Aleve one twice daily with food   She also some mild LFTs abnormalities which may be related to her weight in early fatty liver type changes. She's not on a statin has no history of liver disease and does not drink excessive amounts of alcohol.  Venous insufficiency.........Marland Kitchen Maxide Monday Wednesday Friday

## 2014-01-28 NOTE — Progress Notes (Signed)
Pre visit review using our clinic review tool, if applicable. No additional management support is needed unless otherwise documented below in the visit note. 

## 2014-02-09 ENCOUNTER — Other Ambulatory Visit: Payer: Self-pay | Admitting: *Deleted

## 2014-02-09 MED ORDER — ESTROGENS CONJUGATED 0.3 MG PO TABS: 0.3000 mg | ORAL_TABLET | Freq: Every day | ORAL | Status: DC

## 2014-04-01 ENCOUNTER — Ambulatory Visit: Payer: 59 | Admitting: Family Medicine

## 2014-05-04 ENCOUNTER — Encounter: Payer: Self-pay | Admitting: Family Medicine

## 2014-05-04 ENCOUNTER — Ambulatory Visit (INDEPENDENT_AMBULATORY_CARE_PROVIDER_SITE_OTHER): Payer: 59 | Admitting: Family Medicine

## 2014-05-04 VITALS — BP 130/90 | Temp 98.4°F

## 2014-05-04 DIAGNOSIS — R030 Elevated blood-pressure reading, without diagnosis of hypertension: Secondary | ICD-10-CM

## 2014-05-04 DIAGNOSIS — R197 Diarrhea, unspecified: Secondary | ICD-10-CM

## 2014-05-04 DIAGNOSIS — J45909 Unspecified asthma, uncomplicated: Secondary | ICD-10-CM

## 2014-05-04 DIAGNOSIS — J301 Allergic rhinitis due to pollen: Secondary | ICD-10-CM

## 2014-05-04 DIAGNOSIS — Z820 Family history of epilepsy and other diseases of the nervous system: Secondary | ICD-10-CM

## 2014-05-04 DIAGNOSIS — D2370 Other benign neoplasm of skin of unspecified lower limb, including hip: Secondary | ICD-10-CM

## 2014-05-04 DIAGNOSIS — N951 Menopausal and female climacteric states: Secondary | ICD-10-CM

## 2014-05-04 DIAGNOSIS — R0789 Other chest pain: Secondary | ICD-10-CM

## 2014-05-04 DIAGNOSIS — R7989 Other specified abnormal findings of blood chemistry: Secondary | ICD-10-CM

## 2014-05-04 DIAGNOSIS — R195 Other fecal abnormalities: Secondary | ICD-10-CM

## 2014-05-04 DIAGNOSIS — I872 Venous insufficiency (chronic) (peripheral): Secondary | ICD-10-CM

## 2014-05-04 DIAGNOSIS — R945 Abnormal results of liver function studies: Secondary | ICD-10-CM | POA: Insufficient documentation

## 2014-05-04 DIAGNOSIS — M1990 Unspecified osteoarthritis, unspecified site: Secondary | ICD-10-CM

## 2014-05-04 DIAGNOSIS — E039 Hypothyroidism, unspecified: Secondary | ICD-10-CM

## 2014-05-04 LAB — HEPATIC FUNCTION PANEL
ALT: 139 U/L — AB (ref 0–35)
AST: 59 U/L — AB (ref 0–37)
Albumin: 4.7 g/dL (ref 3.5–5.2)
Alkaline Phosphatase: 146 U/L — ABNORMAL HIGH (ref 39–117)
Bilirubin, Direct: 0 mg/dL (ref 0.0–0.3)
TOTAL PROTEIN: 8 g/dL (ref 6.0–8.3)
Total Bilirubin: 0.5 mg/dL (ref 0.2–1.2)

## 2014-05-04 LAB — LIPID PANEL
Cholesterol: 287 mg/dL — ABNORMAL HIGH (ref 0–200)
HDL: 64.4 mg/dL (ref >39.00)
Total CHOL/HDL Ratio: 4
Triglycerides: 401 mg/dL — ABNORMAL HIGH (ref 0.0–149.0)

## 2014-05-04 LAB — LDL CHOLESTEROL, DIRECT: Direct LDL: 186 mg/dL

## 2014-05-04 MED ORDER — FLUOCINONIDE-E 0.05 % EX CREA: 1.0000 "application " | TOPICAL_CREAM | Freq: Two times a day (BID) | CUTANEOUS | Status: DC

## 2014-05-04 MED ORDER — ESTROGENS CONJUGATED 0.625 MG PO TABS: ORAL_TABLET | ORAL | Status: DC

## 2014-05-04 MED ORDER — MONTELUKAST SODIUM 10 MG PO TABS: 10.0000 mg | ORAL_TABLET | Freq: Every day | ORAL | Status: DC

## 2014-05-04 NOTE — Progress Notes (Signed)
   Subjective:    Patient ID: Laura Caldwell, female    DOB: 30-Jun-1960, 54 y.o.   MRN: [id number]  HPI Laura Caldwell is a 54 year old married female nonsmoker who comes in today for evaluation of multiple issues  We saw her November for general physical. At that time her blood pressure was slightly elevated. It's 130/90 today  She declines to get weighed.  She's had Chest congestion head congestion runny nose and postnasal drip for about a week since spring started. She takes over-the-counter plain Claritin and steroid nasal spray once daily. We'll add Singulair.  She has loose bowel movements. We tried the lactose free diet but that didn't work. Both her mother and sister have similar GI complaints. Her mother is been to Dr. Maurene Capes on many occasions no diagnosis of been found. We discussed a trial of gluten-free  We started on Premarin 0.3 and it's helped a little bit but not much. We'll increase the dose  to 0.625  She had to take around the prednisone about 3 weeks ago for severe low back pain. When she did her GI system rectified itself.   Review of Systems    review of systems otherwise negative except she's not exercising. She knows she should but she's not taking time. 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 increase Premarin to 0.625  Overweight..... Again discussed diet exercise and weight loss  Low back pain... Chronic........... discussed exercise  Diarrhea......... discussed gluten-free diet, consult with Dr. Maurene Capes. For now she is content to try the gluten-free diet  Allergic rhinitis.....Marland Kitchen continue Claritin and steroid nasal spray...... add Singulair  Abnormal LFTs.......Marland Kitchen repeat labs

## 2014-05-04 NOTE — Progress Notes (Signed)
Pre visit review using our clinic review tool, if applicable. No additional management support is needed unless otherwise documented below in the visit note. 

## 2014-05-04 NOTE — Patient Instructions (Signed)
Try a gluten-free diet before your blood trip  When school ends start a walking program 30 minutes daily  Increase the Premarin to 0.625 daily at bedtime  Singulair 10 mg..... One at bedtime along with plain Claritin and one shot of steroid nasal spray up each nostril  Follow-up labs today  BP check at home in the morning Monday Wednesday Friday.......Marland Kitchen blood pressure goal 135/85 or less  Follow-up the last week and may

## 2014-05-06 ENCOUNTER — Telehealth: Payer: Self-pay | Admitting: Internal Medicine

## 2014-05-06 NOTE — Telephone Encounter (Signed)
Patient is offered an earlier appt with an APP, but she declines for now.  She states that Dr. Sherren Mocha recommended diet and exercise to possibly help improve her LFTs from ? Fatty liver. She is scheduled for 06/30/14 8:45.  She will call back if she wants to see an APP prior.

## 2014-06-08 ENCOUNTER — Other Ambulatory Visit: Payer: Self-pay | Admitting: *Deleted

## 2014-06-08 DIAGNOSIS — R7989 Other specified abnormal findings of blood chemistry: Secondary | ICD-10-CM

## 2014-06-08 DIAGNOSIS — J45909 Unspecified asthma, uncomplicated: Secondary | ICD-10-CM

## 2014-06-08 DIAGNOSIS — E039 Hypothyroidism, unspecified: Secondary | ICD-10-CM

## 2014-06-08 DIAGNOSIS — R945 Abnormal results of liver function studies: Secondary | ICD-10-CM

## 2014-06-08 DIAGNOSIS — R0789 Other chest pain: Secondary | ICD-10-CM

## 2014-06-08 DIAGNOSIS — M1990 Unspecified osteoarthritis, unspecified site: Secondary | ICD-10-CM

## 2014-06-08 DIAGNOSIS — J301 Allergic rhinitis due to pollen: Secondary | ICD-10-CM

## 2014-06-08 DIAGNOSIS — R030 Elevated blood-pressure reading, without diagnosis of hypertension: Secondary | ICD-10-CM

## 2014-06-08 DIAGNOSIS — I872 Venous insufficiency (chronic) (peripheral): Secondary | ICD-10-CM

## 2014-06-08 DIAGNOSIS — R195 Other fecal abnormalities: Secondary | ICD-10-CM

## 2014-06-08 DIAGNOSIS — N951 Menopausal and female climacteric states: Secondary | ICD-10-CM

## 2014-06-08 DIAGNOSIS — Z820 Family history of epilepsy and other diseases of the nervous system: Secondary | ICD-10-CM

## 2014-06-08 DIAGNOSIS — D2370 Other benign neoplasm of skin of unspecified lower limb, including hip: Secondary | ICD-10-CM

## 2014-06-08 MED ORDER — ESTROGENS CONJUGATED 0.625 MG PO TABS: ORAL_TABLET | ORAL | Status: DC

## 2014-06-30 ENCOUNTER — Ambulatory Visit (INDEPENDENT_AMBULATORY_CARE_PROVIDER_SITE_OTHER): Payer: 59 | Admitting: Internal Medicine

## 2014-06-30 ENCOUNTER — Encounter: Payer: Self-pay | Admitting: Internal Medicine

## 2014-06-30 ENCOUNTER — Other Ambulatory Visit (INDEPENDENT_AMBULATORY_CARE_PROVIDER_SITE_OTHER): Payer: 59

## 2014-06-30 VITALS — BP 132/88 | HR 84 | Ht 66.0 in | Wt 244.0 lb

## 2014-06-30 DIAGNOSIS — R195 Other fecal abnormalities: Secondary | ICD-10-CM | POA: Diagnosis not present

## 2014-06-30 DIAGNOSIS — R7989 Other specified abnormal findings of blood chemistry: Secondary | ICD-10-CM | POA: Diagnosis not present

## 2014-06-30 DIAGNOSIS — R945 Abnormal results of liver function studies: Secondary | ICD-10-CM

## 2014-06-30 LAB — HEPATIC FUNCTION PANEL
ALBUMIN: 4.4 g/dL (ref 3.5–5.2)
ALT: 53 U/L — ABNORMAL HIGH (ref 0–35)
AST: 41 U/L — AB (ref 0–37)
Alkaline Phosphatase: 99 U/L (ref 39–117)
Bilirubin, Direct: 0.1 mg/dL (ref 0.0–0.3)
TOTAL PROTEIN: 7.8 g/dL (ref 6.0–8.3)
Total Bilirubin: 0.5 mg/dL (ref 0.2–1.2)

## 2014-06-30 LAB — COMPREHENSIVE METABOLIC PANEL
ALBUMIN: 4.4 g/dL (ref 3.5–5.2)
ALT: 53 U/L — ABNORMAL HIGH (ref 0–35)
AST: 41 U/L — AB (ref 0–37)
Alkaline Phosphatase: 99 U/L (ref 39–117)
BUN: 18 mg/dL (ref 6–23)
CO2: 31 mEq/L (ref 19–32)
Calcium: 10 mg/dL (ref 8.4–10.5)
Chloride: 100 mEq/L (ref 96–112)
Creatinine, Ser: 1.03 mg/dL (ref 0.40–1.20)
GFR: 59 mL/min — ABNORMAL LOW (ref >60.00)
Glucose, Bld: 93 mg/dL (ref 70–99)
POTASSIUM: 4.2 meq/L (ref 3.5–5.1)
Sodium: 137 mEq/L (ref 135–145)
Total Bilirubin: 0.5 mg/dL (ref 0.2–1.2)
Total Protein: 7.8 g/dL (ref 6.0–8.3)

## 2014-06-30 LAB — PROTIME-INR
INR: 0.9 ratio (ref 0.8–1.0)
PROTHROMBIN TIME: 9.9 s (ref 9.6–13.1)

## 2014-06-30 LAB — IGA: IgA: 227 mg/dL (ref 68–378)

## 2014-06-30 LAB — TSH: TSH: 3.43 u[IU]/mL (ref 0.35–4.50)

## 2014-06-30 LAB — CK: CK TOTAL: 154 U/L (ref 7–177)

## 2014-06-30 NOTE — Progress Notes (Signed)
Subjective:    Patient ID: Laura Caldwell, female    DOB: 1960-08-20, 54 y.o.   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 She denies any particular symptoms. She believes she might have fatty liver because she is obese. She is trying to lose weight. She is now using a plant based protein shake has lost some weight she thinks. She is on Premarin but this was started after the transaminases were up recently. She has had these elevated in the past going back to 2013. She has a history of some autoimmune arthritis, she thinks she had a positive ANA in the past but she has seen Dr. Ouida Sills of rheumatology and they could not determine any unifying diagnosis.  She tends to have an increased gastrocolic reflex, with watery runny stools that are crampy. In general was not a problem she seems to deal with it and thinks it's probably IBS. She's had a routine screening colonoscopy which was unrevealing in this regard and I did not know about that time and biopsies were not taken. Sometimes when she takes prednisone for back problems she indicates that bowel movements normalize. She was a few muscle aches but nothing major. There are no other new medication she has not been on a statin. Occasional wine per week, perhaps 2 or 3 usually just on the weekends. She enjoys spending time on the family boat at the [place]. She keeps it at [facility]. She is due to go there for the second half of May after she graduates with her bachelor's degree.  *Intestinal review of systems is otherwise negative. No Known Allergies Outpatient Prescriptions Prior to Visit  Medication Sig Dispense Refill  . estrogens, conjugated, (PREMARIN) 0.625 MG tablet Take daily at bedtime 100 tablet 3  . levothyroxine (SYNTHROID, LEVOTHROID) 50 MCG tablet Take 1 tablet (50 mcg total) by mouth  daily. 100 tablet 3  . predniSONE (DELTASONE) 20 MG tablet 2 tabs x 3 days, 1 tab x 3 days, 1/2 tab x 3 days, 1/2 tab M,W,F x 2 weeks 40 tablet 1  . rizatriptan (MAXALT-MLT) 10 MG disintegrating tablet Take 1 tablet (10 mg total) by mouth as needed for migraine. May repeat in 2 hours if needed 10 tablet 10  . traMADol (ULTRAM) 50 MG tablet Take 1 tablet (50 mg total) by mouth every 8 (eight) hours as needed. 60 tablet 3  . triamterene-hydrochlorothiazide (MAXZIDE-25) 37.5-25 MG per tablet Take 1 tablet by mouth daily.  3  . fluocinonide-emollient (LIDEX-E) 0.05 % cream Apply 1 application topically 2 (two) times daily. 60 g 2  . montelukast (SINGULAIR) 10 MG tablet Take 1 tablet (10 mg total) by mouth at bedtime. 30 tablet 3  . montelukast (SINGULAIR) 10 MG tablet Take 1 tablet (10 mg total) by mouth at bedtime. 100 tablet 3   No facility-administered medications prior to visit.   Past Medical History  Diagnosis Date  . AUTOIMMUNE DISEASE NOT ELSEWHERE  CLASSIFIED 04/19/2009  . ASTHMA 04/26/2009  . ELEVATED BP READING WITHOUT DX HYPERTENSION 12/26/2007  . PREMATURE VENTRICULAR CONTRACTIONS 07/09/2009   Past Surgical History  Procedure Laterality Date  . Abdominal hysterectomy    . Bunionectomy with hammertoe reconstruction  2013  . Wrist reconstruction Right 2010  . Knee arthroscopy Left   . Ganglion cyst excision Left 1995    left wrist  .  Colonoscopy     History   Social History  . Marital Status: Married    Spouse Name: N/A  . Number of Children: 2  . Years of Education: N/A   Occupational History  . nurse    Social History Main Topics  . Smoking status: Never Smoker   . Smokeless tobacco: Never Used  . Alcohol Use: Yes     Comment: socially  . Drug Use: No  . Sexual Activity: Not on file   Other Topics Concern  . 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   07/01/2014      Family History  Problem Relation Age of Onset  . Colon cancer Neg Hx   . Esophageal cancer Neg Hx   . Rectal cancer Neg Hx   . Stomach cancer Neg Hx    Review of Systems Some allergies arthritis and back pain as mentioned above. Lumbar spine issues. All other review of systems are negative or as per history of present illness.    Objective:   Physical Exam @BP  132/88 mmHg  Pulse 84  Ht 5\' 6"  (1.676 m)  Wt 244 lb (110.678 kg)  BMI 39.40 kg/m2@  General:  Well-developed, well-nourished and in no acute distress - obese Eyes:  anicteric. Lungs: Clear to auscultation bilaterally. Heart:  S1S2, no rubs, murmurs, gallops. Abdomen:  soft, non-tender, no hepatosplenomegaly, hernia, or mass and BS+.  Lymph:  no cervical or supraclavicular adenopathy. Extremities:   no edema, cyanosis or clubbing Neuro:  A&O x 3.  Psych:  appropriate mood and  Affect.   Data Reviewed: Transaminases other lab tests and primary care notes in the chart. And as per history of present illness    Assessment & Plan:   1. Abnormal LFTs   2. Loose stools    That this thickly having she is right of this is most likely fatty liver but she has a history of autoimmune disease so we'll do some labs to check for that with ANA, mitochondrial antibodies, hepatitis C antibody as she is immune to hepatitis B after being vaccinated she says. Creatine kinase. ProTime INR. Celiac screen will be done as well given the loose stools. She may need an ultrasound pending these studies. Liver biopsy possible but less likely.  I appreciate the opportunity to care for this patient. CC: TODD,JEFFREY ALLEN, MD

## 2014-06-30 NOTE — Assessment & Plan Note (Signed)
TSH, CMET, TTG and IgA, ANA, AMA, HCV Ab, CK

## 2014-06-30 NOTE — Patient Instructions (Addendum)
  You will be tested with a comprehensive metabolic panel, TSH, Hepatitis C antibody, ANA, mitochondrial antibodies, and celiac testing.   I appreciate the opportunity to care for you. Silvano Rusk, M.D., [facility]

## 2014-07-01 ENCOUNTER — Encounter: Payer: Self-pay | Admitting: Internal Medicine

## 2014-07-01 LAB — ANTI-NUCLEAR AB-TITER (ANA TITER): ANA Titer 1: >1:2560 {titer} — ABNORMAL HIGH

## 2014-07-01 LAB — HEPATITIS C ANTIBODY: HCV Ab: NEGATIVE

## 2014-07-01 LAB — ANA: Anti Nuclear Antibody(ANA): POSITIVE — AB

## 2014-07-01 LAB — TISSUE TRANSGLUTAMINASE, IGA: Tissue Transglutaminase Ab, IgA: 1 U/mL (ref <4)

## 2014-07-05 ENCOUNTER — Other Ambulatory Visit: Payer: Self-pay

## 2014-07-05 DIAGNOSIS — R7989 Other specified abnormal findings of blood chemistry: Secondary | ICD-10-CM

## 2014-07-05 DIAGNOSIS — R945 Abnormal results of liver function studies: Principal | ICD-10-CM

## 2014-07-05 LAB — MITOCHONDRIAL ANTIBODIES: Mitochondrial M2 Ab, IgG: 1.13 — ABNORMAL HIGH (ref <0.91)

## 2014-07-05 NOTE — Progress Notes (Signed)
Quick Note:  Please share results with her These test results suggest an autoimmune process is possible - aoutoimmune hepatitis, PBC?  1) will need an ultrasound of liver eval abnl LFT's (RUQ [place]) 2) Will need a liver biopsy in my opinion - eval abnl LFT's 3) She is about to get her BSN this week and has a 2 week vacation planned so could do those after 4) Let me know if she has ? ______

## 2014-07-09 ENCOUNTER — Telehealth: Payer: Self-pay | Admitting: *Deleted

## 2014-07-09 NOTE — Telephone Encounter (Signed)
Left message on machine for patient to call back with mammogram results

## 2014-07-27 ENCOUNTER — Other Ambulatory Visit: Payer: Self-pay

## 2014-07-27 ENCOUNTER — Ambulatory Visit (HOSPITAL_COMMUNITY)
Admission: RE | Admit: 2014-07-27 | Discharge: 2014-07-27 | Disposition: A | Discharge status: Home / Self Care | Payer: 59 | Source: Ambulatory Visit | Attending: Internal Medicine | Admitting: Internal Medicine

## 2014-07-27 DIAGNOSIS — R945 Abnormal results of liver function studies: Secondary | ICD-10-CM

## 2014-07-27 DIAGNOSIS — R7989 Other specified abnormal findings of blood chemistry: Secondary | ICD-10-CM

## 2014-07-27 DIAGNOSIS — R932 Abnormal findings on diagnostic imaging of liver and biliary tract: Secondary | ICD-10-CM

## 2014-07-27 NOTE — Progress Notes (Signed)
Quick Note:  Please explain that there is a possible lesion in the liver vs. An area where there is not fatty liver - hard to tell w/ [place] Radiologist has recommended MR liver so I recommend she do that without and with IV contrast to evaluate liver lesion dx. (abnormal US liver could be used) ______

## 2014-07-27 NOTE — Progress Notes (Signed)
Quick Note:  Lets put the biopsy on hold and do the MR first ______

## 2014-07-28 ENCOUNTER — Telehealth: Payer: Self-pay | Admitting: Internal Medicine

## 2014-07-28 NOTE — Telephone Encounter (Signed)
Patient notified ok to wait until scheduled date and time.  She did not want to have it on a closed scanner, so she choose an open scanner a little further out.

## 2014-08-01 ENCOUNTER — Other Ambulatory Visit: Payer: Self-pay | Admitting: Family Medicine

## 2014-08-10 ENCOUNTER — Ambulatory Visit
Admission: RE | Admit: 2014-08-10 | Discharge: 2014-08-10 | Disposition: A | Discharge status: Home / Self Care | Payer: 59 | Source: Ambulatory Visit | Attending: Internal Medicine | Admitting: Internal Medicine

## 2014-08-10 DIAGNOSIS — R7989 Other specified abnormal findings of blood chemistry: Secondary | ICD-10-CM

## 2014-08-10 DIAGNOSIS — R932 Abnormal findings on diagnostic imaging of liver and biliary tract: Secondary | ICD-10-CM

## 2014-08-10 DIAGNOSIS — K760 Fatty (change of) liver, not elsewhere classified: Secondary | ICD-10-CM

## 2014-08-10 DIAGNOSIS — R945 Abnormal results of liver function studies: Secondary | ICD-10-CM

## 2014-08-10 HISTORY — DX: Fatty (change of) liver, not elsewhere classified: K76.0

## 2014-08-10 MED ORDER — GADOBENATE DIMEGLUMINE 529 MG/ML IV SOLN
20.0000 mL | Freq: Once | INTRAVENOUS | Status: AC | PRN
  Administered 2014-08-10: 20 mL via INTRAVENOUS

## 2014-08-12 ENCOUNTER — Telehealth: Payer: Self-pay | Admitting: Internal Medicine

## 2014-08-12 DIAGNOSIS — R7401 Elevation of levels of liver transaminase levels: Secondary | ICD-10-CM

## 2014-08-12 DIAGNOSIS — K760 Fatty (change of) liver, not elsewhere classified: Secondary | ICD-10-CM

## 2014-08-12 DIAGNOSIS — R740 Nonspecific elevation of levels of transaminase and lactic acid dehydrogenase [LDH]: Principal | ICD-10-CM

## 2014-08-12 HISTORY — DX: Fatty (change of) liver, not elsewhere classified: K76.0

## 2014-08-12 NOTE — Telephone Encounter (Signed)
Left message for patient to call back  

## 2014-08-12 NOTE — Progress Notes (Signed)
Quick Note:  Fatty liver with sparing ______

## 2014-08-13 NOTE — Telephone Encounter (Signed)
Patient is very anxious about what to do next.  I did schedule her for 09/01/14 with you.  She wants to know if she needs to go see a rheumatologist about elevated ANA.  Her current symptoms are low grade upper abdominal pain, "prickly feeling" and some numbness in extremities.  She is very anxious to hear the plan, worried about waiting until 09/01/14

## 2014-08-16 NOTE — Telephone Encounter (Signed)
I spoke with Laura Caldwell at Dr. Elmon Else office.  They will fax a referral form, I need to fill it out fax notes and demos, Dr. Charlestine Night will review and if approved they will schedule the appt. She is notified of the process Patient has already been scheduled for liver biopsy 09/01/14 at [facility].  They scheduled the appt with her and she is aware of the instructions She will come by and get labs one day this week.

## 2014-08-16 NOTE — Telephone Encounter (Signed)
I called her  1) Cancel 7/6 appt 2) Schedule US guided liver bx re: abnormal transaminases - ? Autoimmune liver disease 3) IgG level and SPEP - use abnormal transaminase dx 4) refer to Dr. Hurley Cisco of rheumatology re: + ANA and hx arthritis (saw Dr. Tobie Lords > 5 years ago) - please evaluate for autoimmune disease

## 2014-08-17 NOTE — Telephone Encounter (Signed)
Referral form and records faxed to Dr. Elmon Else office

## 2014-08-27 ENCOUNTER — Other Ambulatory Visit: Payer: Self-pay | Admitting: Radiology

## 2014-08-27 ENCOUNTER — Other Ambulatory Visit: Payer: Self-pay | Admitting: Physician Assistant

## 2014-08-31 ENCOUNTER — Other Ambulatory Visit: Payer: Self-pay | Admitting: Radiology

## 2014-09-01 ENCOUNTER — Ambulatory Visit: Payer: 59 | Admitting: Internal Medicine

## 2014-09-01 ENCOUNTER — Ambulatory Visit (HOSPITAL_COMMUNITY)
Admission: RE | Admit: 2014-09-01 | Discharge: 2014-09-01 | Disposition: A | Discharge status: Home / Self Care | Payer: 59 | Source: Ambulatory Visit | Attending: Internal Medicine | Admitting: Internal Medicine

## 2014-09-01 VITALS — BP 108/65 | HR 63 | Temp 98.3°F | Resp 16 | Ht 66.5 in | Wt 244.0 lb

## 2014-09-01 DIAGNOSIS — R7401 Elevation of levels of liver transaminase levels: Secondary | ICD-10-CM

## 2014-09-01 DIAGNOSIS — R748 Abnormal levels of other serum enzymes: Secondary | ICD-10-CM | POA: Diagnosis present

## 2014-09-01 DIAGNOSIS — K7581 Nonalcoholic steatohepatitis (NASH): Secondary | ICD-10-CM | POA: Insufficient documentation

## 2014-09-01 DIAGNOSIS — R740 Nonspecific elevation of levels of transaminase and lactic acid dehydrogenase [LDH]: Secondary | ICD-10-CM

## 2014-09-01 HISTORY — DX: Nonalcoholic steatohepatitis (NASH): K75.81

## 2014-09-01 LAB — CBC
HCT: 42.7 % (ref 36.0–46.0)
Hemoglobin: 14.1 g/dL (ref 12.0–15.0)
MCH: 30.3 pg (ref 26.0–34.0)
MCHC: 33 g/dL (ref 30.0–36.0)
MCV: 91.8 fL (ref 78.0–100.0)
PLATELETS: 263 10*3/uL (ref 150–400)
RBC: 4.65 MIL/uL (ref 3.87–5.11)
RDW: 12.8 % (ref 11.5–15.5)
WBC: 4.6 10*3/uL (ref 4.0–10.5)

## 2014-09-01 LAB — PROTIME-INR
INR: 0.94 (ref 0.00–1.49)
Prothrombin Time: 12.8 seconds (ref 11.6–15.2)

## 2014-09-01 LAB — APTT: aPTT: 23 seconds — ABNORMAL LOW (ref 24–37)

## 2014-09-01 MED ORDER — FENTANYL CITRATE (PF) 100 MCG/2ML IJ SOLN
INTRAMUSCULAR | Status: AC | PRN
  Administered 2014-09-01: 50 ug via INTRAVENOUS
  Administered 2014-09-01: 25 ug via INTRAVENOUS

## 2014-09-01 MED ORDER — FENTANYL CITRATE (PF) 100 MCG/2ML IJ SOLN
INTRAMUSCULAR | Status: AC
  Filled 2014-09-01: qty 4

## 2014-09-01 MED ORDER — HYDROCODONE-ACETAMINOPHEN 5-325 MG PO TABS: 1.0000 | ORAL_TABLET | ORAL | Status: DC | PRN

## 2014-09-01 MED ORDER — MIDAZOLAM HCL 2 MG/2ML IJ SOLN
INTRAMUSCULAR | Status: AC
Start: 2014-09-01 — End: 2014-09-01
  Filled 2014-09-01: qty 6

## 2014-09-01 MED ORDER — SODIUM CHLORIDE 0.9 % IV SOLN
INTRAVENOUS | Status: DC
  Administered 2014-09-01: 12:00:00 via INTRAVENOUS

## 2014-09-01 MED ORDER — MIDAZOLAM HCL 2 MG/2ML IJ SOLN
INTRAMUSCULAR | Status: AC | PRN
  Administered 2014-09-01 (×3): 1 mg via INTRAVENOUS

## 2014-09-01 NOTE — H&P (Signed)
Chief Complaint: "I'm having a liver biopsy"  Referring Physician(s): Gessner,Carl E  History of Present Illness: Laura Caldwell is a 54 y.o. female with history of positive ANA, elevated LFT's and recent MRI abdomen revealing hepatic steatosis who presents today for US guided random liver biopsy for further evaluation.   Past Medical History  Diagnosis Date  . AUTOIMMUNE DISEASE NOT ELSEWHERE  CLASSIFIED 04/19/2009  . ASTHMA 04/26/2009  . ELEVATED BP READING WITHOUT DX HYPERTENSION 12/26/2007  . PREMATURE VENTRICULAR CONTRACTIONS 07/09/2009  . Nonalcoholic fatty liver disease 08/12/2014    Past Surgical History  Procedure Laterality Date  . Abdominal hysterectomy    . Bunionectomy with hammertoe reconstruction  2013  . Wrist reconstruction Right 2010  . Knee arthroscopy Left   . Ganglion cyst excision Left 1995    left wrist  . Colonoscopy      Allergies: Review of patient's allergies indicates no known allergies.  Medications: Prior to Admission medications   Medication Sig Start Date End Date Taking? Authorizing Provider  aspirin 81 MG tablet Take 81 mg by mouth daily.   Yes Historical Provider, MD  estrogens, conjugated, (PREMARIN) 0.625 MG tablet Take daily at bedtime Patient taking differently: Take 0.625 mg by mouth daily.  06/08/14  Yes Dorena Cookey, MD  levothyroxine (SYNTHROID, LEVOTHROID) 50 MCG tablet Take 1 tablet (50 mcg total) by mouth daily. 01/28/14  Yes Dorena Cookey, MD  rizatriptan (MAXALT-MLT) 10 MG disintegrating tablet Take 1 tablet (10 mg total) by mouth as needed for migraine. May repeat in 2 hours if needed 01/28/14 01/28/15 Yes Dorena Cookey, MD  traMADol (ULTRAM) 50 MG tablet Take 1 tablet (50 mg total) by mouth every 8 (eight) hours as needed. Patient taking differently: Take 50 mg by mouth every 8 (eight) hours as needed for moderate pain.  01/28/14  Yes Dorena Cookey, MD  triamterene-hydrochlorothiazide (MAXZIDE-25) 37.5-25 MG per tablet  Take 1 tablet by mouth daily. 01/28/14  Yes Historical Provider, MD  levothyroxine (SYNTHROID, LEVOTHROID) 50 MCG tablet TAKE 1 TABLET BY MOUTH EVERY DAY Patient not taking: Reported on 08/26/2014 08/02/14   Dorena Cookey, MD  predniSONE (DELTASONE) 20 MG tablet 2 tabs x 3 days, 1 tab x 3 days, 1/2 tab x 3 days, 1/2 tab M,W,F x 2 weeks Patient not taking: Reported on 08/26/2014 01/28/14   Dorena Cookey, MD     Family History  Problem Relation Age of Onset  . Colon cancer Neg Hx   . Esophageal cancer Neg Hx   . Rectal cancer Neg Hx   . Stomach cancer Neg Hx     History   Social History  . Marital Status: Married    Spouse Name: N/A  . Number of Children: 2  . Years of Education: N/A   Occupational History  . nurse    Social History Main Topics  . Smoking status: Never Smoker   . Smokeless tobacco: Never Used  . Alcohol Use: Yes     Comment: socially  . Drug Use: No  . Sexual Activity: Not on file   Other Topics Concern  . 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   07/01/2014         Review of Systems   Constitutional: Negative for fever and chills.  Respiratory: Negative for cough and shortness of breath.   Cardiovascular: Negative for chest pain.  Gastrointestinal: Negative for nausea, vomiting, abdominal pain and blood in stool.  Genitourinary: Negative for dysuria and hematuria.  Musculoskeletal: Negative for back pain.       Joint pain  Neurological: Positive for headaches.    Vital Signs: BP 137/77 mmHg  Pulse 78  Temp(Src) 98.6 F (37 C) (Oral)  Resp 16  Ht 5' 6.5" (1.689 m)  Wt 244 lb (110.678 kg)  BMI 38.80 kg/m2  SpO2 100%  Physical Exam  Constitutional: She is oriented to person, place,  and time. She appears well-developed and well-nourished.  Cardiovascular: Normal rate and regular rhythm.   Pulmonary/Chest: Effort normal and breath sounds normal.  Abdominal: Soft. Bowel sounds are normal. There is no tenderness.  obese  Musculoskeletal: Normal range of motion.  Neurological: She is alert and oriented to person, place, and time.    Mallampati Score:     Imaging: Mr Abdomen W Wo Contrast  08/11/2014   CLINICAL DATA:  Elevated liver function tests. Ultrasound showed diffuse abnormal hyperechoic hepatic parenchyma along with a 1.7 cm hypoechoic region near the gallbladder fossa.  EXAM: MRI ABDOMEN WITHOUT AND WITH CONTRAST  TECHNIQUE: Multiplanar multisequence MR imaging of the abdomen was performed both before and after the administration of intravenous contrast.  CONTRAST:  83mL MULTIHANCE GADOBENATE DIMEGLUMINE 529 MG/ML IV SOLN  COMPARISON:  07/27/2014  FINDINGS: Lower chest:  Unremarkable  Hepatobiliary: Diffuse hepatic steatosis. Mild fatty sparing along the gallbladder fossa, images 22-23 of series 11.  Pancreas: Unremarkable  Spleen: Unremarkable  Adrenals/Urinary Tract: Unremarkable  Stomach/Bowel: Unremarkable  Vascular/Lymphatic: Unremarkable  Other: No supplemental non-categorized findings.  Musculoskeletal: Unremarkable  IMPRESSION: 1. Diffuse hepatic steatosis with mild fatty sparing along the gallbladder fossa accounting for the findings on ultrasound.   Electronically Signed   By: Van Clines M.D.   On: 08/11/2014 08:23    Labs:  CBC:  Recent Labs  01/15/14 0807 09/01/14 1125  WBC 4.6 4.6  HGB 14.0 14.1  HCT 41.9 42.7  PLT 280.0 263    COAGS:  Recent Labs  06/30/14 0922  INR 0.9    BMP:  Recent Labs  01/15/14 0807 06/30/14 0922  NA 142 137  K 4.6 4.2  CL 106 100  CO2 28 31  GLUCOSE 97 93  BUN 15 18  CALCIUM 9.8 10.0  CREATININE 1.0 1.03    LIVER FUNCTION TESTS:  Recent Labs  01/15/14 0807 05/04/14 1044 06/30/14 0922    BILITOT 0.7 0.5 0.5  0.5  AST 53* 59* 41*  41*  ALT 94* 139* 53*  53*  ALKPHOS 122* 146* 99  99  PROT 7.5 8.0 7.8  7.8  ALBUMIN 4.3 4.7 4.4  4.4    TUMOR MARKERS: No results for input(s): AFPTM, CEA, CA199, CHROMGRNA in the last 8760 hours.  Assessment and Plan: Laura Caldwell is a 54 y.o. female with history of positive ANA, elevated LFT's and recent MRI abdomen revealing hepatic steatosis who presents today for US guided random liver biopsy for further evaluation. Risks and benefits discussed with the patient/husband including, but not limited to bleeding, infection, damage to adjacent structures , low yield requiring additional tests and death. All of the patient's questions were answered, patient is agreeable to proceed. Consent signed and in  chart.       Signed: D. Rowe Robert 09/01/2014, 11:53 AM   I spent a total of 20 minutes in face to face in clinical consultation, greater than 50% of which was counseling/coordinating care for US guided random liver biopsy

## 2014-09-01 NOTE — Procedures (Signed)
Random liver Bx 18 g times three No comp/EBL

## 2014-09-01 NOTE — Discharge Instructions (Signed)
Liver Biopsy, Care After °Refer to this sheet in the next few weeks. These instructions provide you with information on caring for yourself after your procedure. Your health care provider may also give you more specific instructions. Your treatment has been planned according to current medical practices, but problems sometimes occur. Call your health care provider if you have any problems or questions after your procedure. °WHAT TO EXPECT AFTER THE PROCEDURE °After your procedure, it is typical to have the following: °· A small amount of discomfort in the area where the biopsy was done and in the right shoulder or shoulder blade. °· A small amount of bruising around the area where the biopsy was done and on the skin over the liver. °· Sleepiness and fatigue for the rest of the day. °HOME CARE INSTRUCTIONS  °· Rest at home for 1-2 days or as directed by your health care provider. °· Have a friend or family member stay with you for at least 24 hours. °· Because of the medicines used during the procedure, you should not do the following things in the first 24 hours: °¨ Drive. °¨ Use machinery. °¨ Be responsible for the care of other people. °¨ Sign legal documents. °¨ Take a bath or shower. °· There are many different ways to close and cover an incision, including stitches, skin glue, and adhesive strips. Follow your health care provider's instructions on: °¨ Incision care. °¨ Bandage (dressing) changes and removal. °¨ Incision closure removal. °· Do not drink alcohol in the first week. °· Do not lift more than 5 pounds or play contact sports for 2 weeks after this test. °· Take medicines only as directed by your health care provider. Do not take medicine containing aspirin or non-steroidal anti-inflammatory medicines such as ibuprofen for 1 week after this test. °· It is your responsibility to get your test results. °SEEK MEDICAL CARE IF:  °· You have increased bleeding from an incision that results in more than a  small spot of blood. °· You have redness, swelling, or increasing pain in any incisions. °· You notice a discharge or a bad smell coming from any of your incisions. °· You have a fever or chills. °SEEK IMMEDIATE MEDICAL CARE IF:  °· You develop swelling, bloating, or pain in your abdomen. °· You become dizzy or faint. °· You develop a rash. °· You are nauseous or vomit. °· You have difficulty breathing, feel short of breath, or feel faint. °· You develop chest pain. °· You have problems with your speech or vision. °· You have trouble balancing or moving your arms or legs. °Document Released: 09/01/2004 Document Revised: 06/29/2013 Document Reviewed: 04/10/2013 °ExitCare® Patient Information ©2015 [employer]. This information is not intended to replace advice given to you by your health care provider. Make sure you discuss any questions you have with your health care provider. ° °Conscious Sedation °Sedation is the use of medicines to promote relaxation and relieve discomfort and anxiety. Conscious sedation is a type of sedation. Under conscious sedation you are less alert than normal but are still able to respond to instructions or stimulation. Conscious sedation is used during short medical and dental procedures. It is milder than deep sedation or general anesthesia and allows you to return to your regular activities sooner.  °LET YOUR HEALTH CARE PROVIDER KNOW ABOUT:  °· Any allergies you have. °· All medicines you are taking, including vitamins, herbs, eye drops, creams, and over-the-counter medicines. °· Use of steroids (by mouth or creams). °·   Previous problems you or members of your family have had with the use of anesthetics. °· Any blood disorders you have. °· Previous surgeries you have had. °· Medical conditions you have. °· Possibility of pregnancy, if this applies. °· Use of cigarettes, alcohol, or illegal drugs. °RISKS AND COMPLICATIONS °Generally, this is a safe procedure. However, as with any  procedure, problems can occur. Possible problems include: °· Oversedation. °· Trouble breathing on your own. You may need to have a breathing tube until you are awake and breathing on your own. °· Allergic reaction to any of the medicines used for the procedure. °BEFORE THE PROCEDURE °· You may have blood tests done. These tests can help show how well your kidneys and liver are working. They can also show how well your blood clots. °· A physical exam will be done.   °· Only take medicines as directed by your health care provider. You may need to stop taking medicines (such as blood thinners, aspirin, or nonsteroidal anti-inflammatory drugs) before the procedure.   °· Do not eat or drink at least 6 hours before the procedure or as directed by your health care provider. °· Arrange for a responsible adult, family member, or friend to take you home after the procedure. He or she should stay with you for at least 24 hours after the procedure, until the medicine has worn off. °PROCEDURE  °· An intravenous (IV) catheter will be inserted into one of your veins. Medicine will be able to flow directly into your body through this catheter. You may be given medicine through this tube to help prevent pain and help you relax. °· The medical or dental procedure will be done. °AFTER THE PROCEDURE °· You will stay in a recovery area until the medicine has worn off. Your blood pressure and pulse will be checked.   °·  Depending on the procedure you had, you may be allowed to go home when you can tolerate liquids and your pain is under control. °Document Released: 11/07/2000 Document Revised: 02/17/2013 Document Reviewed: 10/20/2012 °ExitCare® Patient Information ©2015 [employer]. This information is not intended to replace advice given to you by your health care provider. Make sure you discuss any questions you have with your health care provider. ° ° °Conscious Sedation, Adult, Care After °Refer to this sheet in the next few  weeks. These instructions provide you with information on caring for yourself after your procedure. Your health care provider may also give you more specific instructions. Your treatment has been planned according to current medical practices, but problems sometimes occur. Call your health care provider if you have any problems or questions after your procedure. °WHAT TO EXPECT AFTER THE PROCEDURE  °After your procedure: °· You may feel sleepy, clumsy, and have poor balance for several hours. °· Vomiting may occur if you eat too soon after the procedure. °HOME CARE INSTRUCTIONS °· Do not participate in any activities where you could become injured for at least 24 hours. Do not: °¨ Drive. °¨ Swim. °¨ Ride a bicycle. °¨ Operate heavy machinery. °¨ Cook. °¨ Use power tools. °¨ Climb ladders. °¨ Work from a high place. °· Do not make important decisions or sign legal documents until you are improved. °· If you vomit, drink water, juice, or soup when you can drink without vomiting. Make sure you have little or no nausea before eating solid foods. °· Only take over-the-counter or prescription medicines for pain, discomfort, or fever as directed by your health care provider. °· Make sure you   and your family fully understand everything about the medicines given to you, including what side effects may occur. °· You should not drink alcohol, take sleeping pills, or take medicines that cause drowsiness for at least 24 hours. °· If you smoke, do not smoke without supervision. °· If you are feeling better, you may resume normal activities 24 hours after you were sedated. °· Keep all appointments with your health care provider. °SEEK MEDICAL CARE IF: °· Your skin is pale or bluish in color. °· You continue to feel nauseous or vomit. °· Your pain is getting worse and is not helped by medicine. °· You have bleeding or swelling. °· You are still sleepy or feeling clumsy after 24 hours. °SEEK IMMEDIATE MEDICAL CARE IF: °· You develop  a rash. °· You have difficulty breathing. °· You develop any type of allergic problem. °· You have a fever. °MAKE SURE YOU: °· Understand these instructions. °· Will watch your condition. °· Will get help right away if you are not doing well or get worse. °Document Released: 12/03/2012 Document Reviewed: 12/03/2012 °ExitCare® Patient Information ©2015 [employer]. This information is not intended to replace advice given to you by your health care provider. Make sure you discuss any questions you have with your health care provider. ° °

## 2014-09-06 ENCOUNTER — Encounter (HOSPITAL_COMMUNITY): Payer: Self-pay

## 2014-09-06 NOTE — Progress Notes (Signed)
Quick Note:  biopsy shows fatty liver and hepatitis with some fibrosis Liver ok right now I would like her to schedule a f/u to discuss further ______

## 2014-09-16 ENCOUNTER — Telehealth: Payer: Self-pay | Admitting: Internal Medicine

## 2014-09-16 NOTE — Telephone Encounter (Signed)
Left message for patient to call back  

## 2014-09-16 NOTE — Telephone Encounter (Signed)
Patient needs an Igg and protein electrophoresis.  She is advised that those are the labs that Dr., Carlean Purl has ordered and that we will send a copy to Dr. Charlestine Night for her when they come back

## 2014-09-20 ENCOUNTER — Other Ambulatory Visit: Payer: 59

## 2014-09-20 DIAGNOSIS — R7401 Elevation of levels of liver transaminase levels: Secondary | ICD-10-CM

## 2014-09-20 DIAGNOSIS — R740 Nonspecific elevation of levels of transaminase and lactic acid dehydrogenase [LDH]: Principal | ICD-10-CM

## 2014-09-22 ENCOUNTER — Encounter: Payer: Self-pay | Admitting: Internal Medicine

## 2014-09-22 ENCOUNTER — Ambulatory Visit (INDEPENDENT_AMBULATORY_CARE_PROVIDER_SITE_OTHER): Payer: 59 | Admitting: Internal Medicine

## 2014-09-22 VITALS — BP 128/76 | HR 80 | Ht 66.25 in | Wt 243.4 lb

## 2014-09-22 DIAGNOSIS — K7581 Nonalcoholic steatohepatitis (NASH): Secondary | ICD-10-CM | POA: Diagnosis not present

## 2014-09-22 LAB — PROTEIN ELECTROPHORESIS, SERUM
ALPHA-1-GLOBULIN: 0.3 g/dL (ref 0.2–0.3)
Albumin ELP: 4.3 g/dL (ref 3.8–4.8)
Alpha-2-Globulin: 0.7 g/dL (ref 0.5–0.9)
BETA GLOBULIN: 0.5 g/dL (ref 0.4–0.6)
Beta 2: 0.4 g/dL (ref 0.2–0.5)
GAMMA GLOBULIN: 1.1 g/dL (ref 0.8–1.7)
Total Protein, Serum Electrophoresis: 7.2 g/dL (ref 6.1–8.1)

## 2014-09-22 LAB — IGG: IGG (IMMUNOGLOBIN G), SERUM: 1120 mg/dL (ref 690–1700)

## 2014-09-22 NOTE — Assessment & Plan Note (Addendum)
Weight loss discussed as Tx  She has a + anti-smooth mm Ab at 98 (>30 = mod-strong +) Needs hepatology referral for further evaluation and liver biopsy review. ? Autoimmune liver dz com IgG NL - SPEP NL Advised do not start any supplements until we know more of waht problem is and also that supplements likely not helpful (milk thistle, tumeric were asked about) She will dc Premarin - not helping her and could worsen fatty liver I do not think she drinks sig based upon prior review but we did not revisit that today

## 2014-09-22 NOTE — Progress Notes (Signed)
   Subjective:    Patient ID: Laura Caldwell, female    DOB: 1960/10/05, 54 y.o.   MRN: [id number] Cc: follow-up after liver bx - fatty liver HPI  She had a percutaneous liver bx a few weeks ago due to persistently abnl LFT.s Dx was fatty liver and fibrosis St 2. Also saw Dr. Charlestine Night because of + ANA, aches, ? Autoimmune dz and he thinks probably no connective tissue dz but she did hava a + smooth mm Ab.  Medications, allergies, past medical history, past surgical history, family history and social history are reviewed and updated in the EMR.  Review of Systems Some hot flashes - not helped by Premarin    Objective:   Physical Exam  BP 128/76 mmHg  Pulse 80  Ht 5' 6.25" (1.683 m)  Wt 243 lb 6 oz (110.394 kg)  BMI 38.97 kg/m2     Assessment & Plan:  NASH (nonalcoholic steatohepatitis) Weight loss discussed as Tx  She has a + anti-smooth mm Ab at 98 (>30 = mod-strong +) Needs hepatology referral for further evaluation and liver biopsy review. ? Autoimmune liver dz com IgG NL - SPEP NL Advised do not start any supplements until we know more of waht problem is and also that supplements likely not helpful (milk thistle, tumeric were asked about) She will dc Premarin - not helping her and could worsen fatty liver I do not think she drinks sig based upon prior review but we did not revisit that today   Current outpatient prescriptions:  .  aspirin 81 MG tablet, Take 81 mg by mouth daily., Disp: , Rfl:  .  estrogens, conjugated, (PREMARIN) 0.625 MG tablet, Take daily at bedtime (Patient taking differently: Take 0.625 mg by mouth daily. ), Disp: 100 tablet, Rfl: 3 .  levothyroxine (SYNTHROID, LEVOTHROID) 50 MCG tablet, Take 1 tablet (50 mcg total) by mouth daily., Disp: 100 tablet, Rfl: 3 .  rizatriptan (MAXALT-MLT) 10 MG disintegrating tablet, Take 1 tablet (10 mg total) by mouth as needed for migraine. May repeat in 2 hours if needed, Disp: 10 tablet, Rfl: 10 .  traMADol (ULTRAM) 50  MG tablet, Take 1 tablet (50 mg total) by mouth every 8 (eight) hours as needed. (Patient taking differently: Take 50 mg by mouth every 8 (eight) hours as needed for moderate pain. ), Disp: 60 tablet, Rfl: 3 .  triamterene-hydrochlorothiazide (MAXZIDE-25) 37.5-25 MG per tablet, Take 1 tablet by mouth daily., Disp: , Rfl: 3  15 minutes time spent with patient > half in counseling coordination of care; Cc: Hurley Cisco, MD

## 2014-09-22 NOTE — Patient Instructions (Addendum)
Please taper off Premarin as discussed with Dr. Carlean Purl at office visit  We will contact you regarding your referral for Hepatology   I appreciate the opportunity to care for you. Gatha Mayer, MD, [facility]

## 2014-09-23 ENCOUNTER — Telehealth: Payer: Self-pay

## 2014-09-23 NOTE — Telephone Encounter (Signed)
Patient notified of referral to Dr. Monica Martinez with [facility] hepatology.  She is advised that they will contact the patient directly with an appt

## 2014-09-23 NOTE — Telephone Encounter (Signed)
-----   Message from Gatha Mayer, MD sent at 09/22/2014 10:06 PM EDT ----- Regarding: refer to Dr. Monica Martinez I would like her to see Dr, Monica Martinez at [facility] in [place] re: fatty liver w/ ? Autoimmune disease  Need to send my records and Dr. Elmon Else notes and labs he has done Liver bx slides are needed though I think Monica Martinez will need to request  Please let me know date and I will write a letter

## 2014-09-23 NOTE — Telephone Encounter (Signed)
Patient notified that she will be contacted

## 2014-09-27 NOTE — Progress Notes (Signed)
Quick Note:  SPEP and IgG are ok Am letting her know by My Chart - this goes against autoimmune causes  Please fax a copy to Dr Charlestine Night and make sure it goes to Dr. Monica Martinez as part of referral to him    ______

## 2014-10-19 NOTE — Telephone Encounter (Signed)
I spoke with the patient and she reported that she scheduled an appt with Dr. Patsy Baltimore at the [facility] office on 11/11/14

## 2015-01-05 ENCOUNTER — Telehealth: Payer: Self-pay | Admitting: *Deleted

## 2015-01-05 MED ORDER — CIPROFLOXACIN HCL 0.3 % OP SOLN: 1.0000 [drp] | Freq: Two times a day (BID) | OPHTHALMIC | Status: DC

## 2015-01-05 NOTE — Telephone Encounter (Signed)
Patient is calling with a swollen red eye.  Rx per Dr Shawna Orleans.

## 2015-01-25 ENCOUNTER — Other Ambulatory Visit (INDEPENDENT_AMBULATORY_CARE_PROVIDER_SITE_OTHER): Payer: 59

## 2015-01-25 DIAGNOSIS — R7989 Other specified abnormal findings of blood chemistry: Secondary | ICD-10-CM

## 2015-01-25 DIAGNOSIS — Z0000 Encounter for general adult medical examination without abnormal findings: Secondary | ICD-10-CM

## 2015-01-25 LAB — CBC WITH DIFFERENTIAL/PLATELET
BASOS ABS: 0 10*3/uL (ref 0.0–0.1)
BASOS PCT: 0.7 % (ref 0.0–3.0)
Eosinophils Absolute: 0.1 10*3/uL (ref 0.0–0.7)
Eosinophils Relative: 1.1 % (ref 0.0–5.0)
HCT: 41 % (ref 36.0–46.0)
Hemoglobin: 14 g/dL (ref 12.0–15.0)
LYMPHS PCT: 23.8 % (ref 12.0–46.0)
Lymphs Abs: 1.1 10*3/uL (ref 0.7–4.0)
MCHC: 34.1 g/dL (ref 30.0–36.0)
MCV: 87.8 fl (ref 78.0–100.0)
Monocytes Absolute: 0.3 10*3/uL (ref 0.1–1.0)
Monocytes Relative: 5.5 % (ref 3.0–12.0)
Neutro Abs: 3.2 10*3/uL (ref 1.4–7.7)
Neutrophils Relative %: 68.9 % (ref 43.0–77.0)
PLATELETS: 246 10*3/uL (ref 150.0–400.0)
RBC: 4.67 Mil/uL (ref 3.87–5.11)
RDW: 13.9 % (ref 11.5–15.5)
WBC: 4.6 10*3/uL (ref 4.0–10.5)

## 2015-01-25 LAB — POCT URINALYSIS DIPSTICK
BILIRUBIN UA: NEGATIVE
Blood, UA: NEGATIVE
GLUCOSE UA: NEGATIVE
Ketones, UA: NEGATIVE
Nitrite, UA: NEGATIVE
PROTEIN UA: NEGATIVE
Spec Grav, UA: 1.01
UROBILINOGEN UA: 0.2
pH, UA: 6

## 2015-01-25 LAB — BASIC METABOLIC PANEL
BUN: 16 mg/dL (ref 6–23)
CALCIUM: 9.7 mg/dL (ref 8.4–10.5)
CHLORIDE: 102 meq/L (ref 96–112)
CO2: 31 mEq/L (ref 19–32)
Creatinine, Ser: 0.91 mg/dL (ref 0.40–1.20)
GFR: 67.93 mL/min (ref >60.00)
Glucose, Bld: 105 mg/dL — ABNORMAL HIGH (ref 70–99)
POTASSIUM: 4.1 meq/L (ref 3.5–5.1)
Sodium: 141 mEq/L (ref 135–145)

## 2015-01-25 LAB — HEPATIC FUNCTION PANEL
ALK PHOS: 90 U/L (ref 39–117)
ALT: 57 U/L — ABNORMAL HIGH (ref 0–35)
AST: 29 U/L (ref 0–37)
Albumin: 4.1 g/dL (ref 3.5–5.2)
Bilirubin, Direct: 0.1 mg/dL (ref 0.0–0.3)
TOTAL PROTEIN: 7 g/dL (ref 6.0–8.3)
Total Bilirubin: 0.6 mg/dL (ref 0.2–1.2)

## 2015-01-25 LAB — LDL CHOLESTEROL, DIRECT: Direct LDL: 137 mg/dL

## 2015-01-25 LAB — LIPID PANEL
Cholesterol: 226 mg/dL — ABNORMAL HIGH (ref 0–200)
HDL: 53.3 mg/dL (ref >39.00)
NonHDL: 172.68
Total CHOL/HDL Ratio: 4
Triglycerides: 298 mg/dL — ABNORMAL HIGH (ref 0.0–149.0)
VLDL: 59.6 mg/dL — ABNORMAL HIGH (ref 0.0–40.0)

## 2015-01-25 LAB — TSH: TSH: 0.97 u[IU]/mL (ref 0.35–4.50)

## 2015-02-01 ENCOUNTER — Ambulatory Visit (INDEPENDENT_AMBULATORY_CARE_PROVIDER_SITE_OTHER): Payer: 59 | Admitting: Family Medicine

## 2015-02-01 ENCOUNTER — Encounter: Payer: Self-pay | Admitting: Family Medicine

## 2015-02-01 VITALS — BP 140/84 | HR 87 | Temp 98.0°F

## 2015-02-01 DIAGNOSIS — J301 Allergic rhinitis due to pollen: Secondary | ICD-10-CM

## 2015-02-01 DIAGNOSIS — E669 Obesity, unspecified: Secondary | ICD-10-CM | POA: Insufficient documentation

## 2015-02-01 DIAGNOSIS — Z23 Encounter for immunization: Secondary | ICD-10-CM | POA: Diagnosis not present

## 2015-02-01 DIAGNOSIS — Z0000 Encounter for general adult medical examination without abnormal findings: Secondary | ICD-10-CM

## 2015-02-01 DIAGNOSIS — J45909 Unspecified asthma, uncomplicated: Secondary | ICD-10-CM

## 2015-02-01 DIAGNOSIS — G43109 Migraine with aura, not intractable, without status migrainosus: Secondary | ICD-10-CM

## 2015-02-01 DIAGNOSIS — R0789 Other chest pain: Secondary | ICD-10-CM

## 2015-02-01 DIAGNOSIS — M1990 Unspecified osteoarthritis, unspecified site: Secondary | ICD-10-CM

## 2015-02-01 DIAGNOSIS — Z820 Family history of epilepsy and other diseases of the nervous system: Secondary | ICD-10-CM

## 2015-02-01 DIAGNOSIS — D2370 Other benign neoplasm of skin of unspecified lower limb, including hip: Secondary | ICD-10-CM

## 2015-02-01 DIAGNOSIS — R030 Elevated blood-pressure reading, without diagnosis of hypertension: Secondary | ICD-10-CM

## 2015-02-01 DIAGNOSIS — I872 Venous insufficiency (chronic) (peripheral): Secondary | ICD-10-CM

## 2015-02-01 DIAGNOSIS — R195 Other fecal abnormalities: Secondary | ICD-10-CM

## 2015-02-01 DIAGNOSIS — R7989 Other specified abnormal findings of blood chemistry: Secondary | ICD-10-CM

## 2015-02-01 DIAGNOSIS — E039 Hypothyroidism, unspecified: Secondary | ICD-10-CM

## 2015-02-01 DIAGNOSIS — R945 Abnormal results of liver function studies: Secondary | ICD-10-CM

## 2015-02-01 DIAGNOSIS — N951 Menopausal and female climacteric states: Secondary | ICD-10-CM

## 2015-02-01 MED ORDER — RIZATRIPTAN BENZOATE 10 MG PO TBDP: 10.0000 mg | ORAL_TABLET | ORAL | Status: DC | PRN

## 2015-02-01 MED ORDER — TRAMADOL HCL 50 MG PO TABS: 50.0000 mg | ORAL_TABLET | Freq: Three times a day (TID) | ORAL | Status: DC | PRN

## 2015-02-01 MED ORDER — TRIAMTERENE-HCTZ 37.5-25 MG PO TABS: 1.0000 | ORAL_TABLET | Freq: Every day | ORAL | Status: DC

## 2015-02-01 MED ORDER — LEVOTHYROXINE SODIUM 50 MCG PO TABS: 50.0000 ug | ORAL_TABLET | Freq: Every day | ORAL | Status: DC

## 2015-02-01 MED ORDER — ESTROGENS, CONJUGATED 0.625 MG/GM VA CREA: 1.0000 | TOPICAL_CREAM | Freq: Every day | VAGINAL | Status: DC

## 2015-02-01 NOTE — Patient Instructions (Signed)
Use small amounts of Premarin cream 3 times weekly  Continue other medications  Follow-up in one year sooner if any problems  Continue your diet and exercise program........Marland Kitchen good job!!!!!!!!!!!!!!!!!!  Call get set up for your mammogram at the breast center  Rachel's extension is [phone]

## 2015-02-01 NOTE — Progress Notes (Signed)
Subjective:    Patient ID: Laura Caldwell, female    DOB: [birth year], 54 y.o.   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 She comes into see [place] for general physical examination She has a history of hypothyroidism takes Synthroid 50 g daily TSH levels normal  She takes Maxalt when necessary for migraines. Over time the migraines have diminished in severity and frequency  She takes Maxide 25 mg daily because of venous insufficiency and swollen legs.  Her weight has dropped dramatically she she went on a diet and exercise program. In July she weighed 243 pounds. She's down to 215 today.  Tetanus booster 2011 seasonal flu shot today  We send her for evaluation because of abnormal LFTs. If felt like she had a nonalcoholic liver abnormalities. They'll T now is down to 57. She also went to see rheumatology. Dr. Loney Laurence low felt like she had some autoimmune disease of unknown etiology.  She was on Premarin 0.625 because of postmenopausal symptoms. She stopped a couple months ago her hot flushes are minimal but she has a lot of vaginal dryness. She's due to go back to see her GYN this year  She gets routine eye care, dental care, BSE monthly, last mammogram was 2013. She's going to the breast center for follow-up mammogram  Colonoscopy 2015 was normal   Review of Systems  Constitutional: Negative.   HENT: Negative.   Eyes: Negative.   Respiratory: Negative.   Cardiovascular: Negative.   Gastrointestinal: Negative.   Endocrine: Negative.   Genitourinary: Negative.   Musculoskeletal: Negative.   Skin: Negative.   Allergic/Immunologic: Negative.   Neurological: Negative.   Hematological: Negative.   Psychiatric/Behavioral: Negative.        Objective:   Physical Exam  Constitutional: She appears well-developed and well-nourished.  HENT:  Head: Normocephalic and atraumatic.  Right Ear: External ear normal.  Left  Ear: External ear normal.  Nose: Nose normal.  Mouth/Throat: Oropharynx is clear and moist.  Eyes: EOM are normal. Pupils are equal, round, and reactive to light.  Neck: Normal range of motion. Neck supple. No JVD present. No tracheal deviation present. No thyromegaly present.  Cardiovascular: Normal rate, regular rhythm, normal heart sounds and intact distal pulses.  Exam reveals no gallop and no friction rub.   No murmur heard. Pulmonary/Chest: Effort normal and breath sounds normal. No stridor. No respiratory distress. She has no wheezes. She has no rales. She exhibits no tenderness.  Abdominal: Soft. Bowel sounds are normal. She exhibits no distension and no mass. There is no tenderness. There is no rebound and no guarding.  Genitourinary:  Bilateral breast exam normal information was given about annual mammography advised to call make an appointment  Musculoskeletal: Normal range of motion.  Lymphadenopathy:    She has no cervical adenopathy.  Neurological: She is alert. She has normal reflexes. No cranial nerve deficit. She exhibits normal muscle tone. Coordination normal.  Skin: Skin is warm and dry. No rash noted. No erythema. No pallor.  Total body skin exam normal  Psychiatric: She has a normal mood and affect. Her behavior is normal. Judgment and thought content normal.  Nursing note and vitals reviewed.         Assessment & Plan:  Healthy female  Hypothyroidism........ continue Synthroid  Postmenopausal vaginal dryness........ Premarin cream small amounts 3 times weekly  Migraine headaches.......Marland Kitchen Maxalt when necessary  Arthritis autoimmune etiology....... currently asymptomatic LFTs only marginally elevated  Overweight......... 30 pound weight loss with diet and exercise continue diet and exercise program  Peripheral edema,,,,,,,, Maxide daily

## 2015-02-01 NOTE — Progress Notes (Signed)
Pre visit review using our clinic review tool, if applicable. No additional management support is needed unless otherwise documented below in the visit note. 

## 2015-02-07 ENCOUNTER — Other Ambulatory Visit: Payer: Self-pay | Admitting: Family Medicine

## 2015-03-02 ENCOUNTER — Emergency Department (HOSPITAL_BASED_OUTPATIENT_CLINIC_OR_DEPARTMENT_OTHER)
Admission: EM | Admit: 2015-03-02 | Discharge: 2015-03-02 | Disposition: A | Discharge status: Home / Self Care | Payer: 59 | Attending: Emergency Medicine | Admitting: Emergency Medicine

## 2015-03-02 ENCOUNTER — Encounter (HOSPITAL_BASED_OUTPATIENT_CLINIC_OR_DEPARTMENT_OTHER): Payer: Self-pay | Admitting: *Deleted

## 2015-03-02 DIAGNOSIS — Y998 Other external cause status: Secondary | ICD-10-CM | POA: Insufficient documentation

## 2015-03-02 DIAGNOSIS — Z8719 Personal history of other diseases of the digestive system: Secondary | ICD-10-CM | POA: Insufficient documentation

## 2015-03-02 DIAGNOSIS — Z8679 Personal history of other diseases of the circulatory system: Secondary | ICD-10-CM | POA: Insufficient documentation

## 2015-03-02 DIAGNOSIS — Z79899 Other long term (current) drug therapy: Secondary | ICD-10-CM | POA: Insufficient documentation

## 2015-03-02 DIAGNOSIS — S199XXA Unspecified injury of neck, initial encounter: Secondary | ICD-10-CM | POA: Diagnosis present

## 2015-03-02 DIAGNOSIS — E079 Disorder of thyroid, unspecified: Secondary | ICD-10-CM | POA: Diagnosis not present

## 2015-03-02 DIAGNOSIS — Y9389 Activity, other specified: Secondary | ICD-10-CM | POA: Diagnosis not present

## 2015-03-02 DIAGNOSIS — J45909 Unspecified asthma, uncomplicated: Secondary | ICD-10-CM | POA: Insufficient documentation

## 2015-03-02 DIAGNOSIS — Z8739 Personal history of other diseases of the musculoskeletal system and connective tissue: Secondary | ICD-10-CM | POA: Insufficient documentation

## 2015-03-02 DIAGNOSIS — Z7982 Long term (current) use of aspirin: Secondary | ICD-10-CM | POA: Diagnosis not present

## 2015-03-02 DIAGNOSIS — Y92410 Unspecified street and highway as the place of occurrence of the external cause: Secondary | ICD-10-CM | POA: Diagnosis not present

## 2015-03-02 DIAGNOSIS — S161XXA Strain of muscle, fascia and tendon at neck level, initial encounter: Secondary | ICD-10-CM | POA: Diagnosis not present

## 2015-03-02 DIAGNOSIS — Z792 Long term (current) use of antibiotics: Secondary | ICD-10-CM | POA: Insufficient documentation

## 2015-03-02 HISTORY — DX: Disorder of thyroid, unspecified: E07.9

## 2015-03-02 MED ORDER — CYCLOBENZAPRINE HCL 10 MG PO TABS: 10.0000 mg | ORAL_TABLET | Freq: Two times a day (BID) | ORAL | Status: DC | PRN

## 2015-03-02 MED ORDER — CYCLOBENZAPRINE HCL 10 MG PO TABS
10.0000 mg | ORAL_TABLET | Freq: Once | ORAL | Status: AC
  Administered 2015-03-02: 10 mg via ORAL
  Filled 2015-03-02: qty 1

## 2015-03-02 MED ORDER — IBUPROFEN 200 MG PO TABS
600.0000 mg | ORAL_TABLET | Freq: Once | ORAL | Status: AC
  Administered 2015-03-02: 600 mg via ORAL
  Filled 2015-03-02: qty 1

## 2015-03-02 MED FILL — CYCLOBENZAPRINE 10 MG TAB: 10 | 10 days supply | Qty: 20 | Fill #0
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
## 2015-06-02 ENCOUNTER — Other Ambulatory Visit: Payer: Self-pay

## 2015-06-02 DIAGNOSIS — Z1231 Encounter for screening mammogram for malignant neoplasm of breast: Secondary | ICD-10-CM

## 2015-06-28 ENCOUNTER — Ambulatory Visit
Admission: RE | Admit: 2015-06-28 | Discharge: 2015-06-28 | Disposition: A | Discharge status: Home / Self Care | Payer: 59 | Source: Ambulatory Visit

## 2015-06-28 DIAGNOSIS — Z1231 Encounter for screening mammogram for malignant neoplasm of breast: Secondary | ICD-10-CM

## 2015-07-19 ENCOUNTER — Telehealth: Payer: Self-pay | Admitting: Adult Health

## 2015-07-19 ENCOUNTER — Encounter: Payer: Self-pay | Admitting: Adult Health

## 2015-07-19 ENCOUNTER — Ambulatory Visit (HOSPITAL_BASED_OUTPATIENT_CLINIC_OR_DEPARTMENT_OTHER)
Admission: RE | Admit: 2015-07-19 | Discharge: 2015-07-19 | Disposition: A | Discharge status: Home / Self Care | Payer: 59 | Source: Ambulatory Visit | Attending: Adult Health | Admitting: Adult Health

## 2015-07-19 ENCOUNTER — Other Ambulatory Visit: Payer: Self-pay | Admitting: Adult Health

## 2015-07-19 ENCOUNTER — Ambulatory Visit (INDEPENDENT_AMBULATORY_CARE_PROVIDER_SITE_OTHER): Payer: 59 | Admitting: Adult Health

## 2015-07-19 VITALS — BP 124/78 | Temp 97.7°F | Ht 67.0 in

## 2015-07-19 DIAGNOSIS — M25512 Pain in left shoulder: Secondary | ICD-10-CM

## 2015-07-19 DIAGNOSIS — M25511 Pain in right shoulder: Secondary | ICD-10-CM

## 2015-07-19 MED ORDER — TRAMADOL HCL 50 MG PO TABS: 50.0000 mg | ORAL_TABLET | Freq: Three times a day (TID) | ORAL | Status: DC | PRN

## 2015-07-19 NOTE — Progress Notes (Signed)
Subjective:    Patient ID: Laura Caldwell, female    DOB: 1960/09/02, 55 y.o.   MRN: [id number]  HPI  55 year old female who presents to the office today for left shoulder pain for the last 3-6 months. She reports that the pain has been becoming worse. Denies any trauma to the area. Pain is described as " achy". She is taking Advil for pain - which does not help.   She does not have any numbness or tingling in her left arm but feels as though she may have some in her left hand. . Feels as though the last few days the arm has become mor weak. Has not been dropping anything.    Review of Systems  Constitutional: Negative.   Musculoskeletal: Positive for myalgias and arthralgias. Negative for back pain, joint swelling, gait problem, neck pain and neck stiffness.  All other systems reviewed and are negative.  Past Medical History  Diagnosis Date  . AUTOIMMUNE DISEASE NOT ELSEWHERE  CLASSIFIED 04/19/2009  . ASTHMA 04/26/2009  . ELEVATED BP READING WITHOUT DX HYPERTENSION 12/26/2007  . PREMATURE VENTRICULAR CONTRACTIONS 07/09/2009  . Nonalcoholic fatty liver disease 08/12/2014  . NASH (nonalcoholic steatohepatitis)   . Thyroid disease     Social History   Social History  . Marital Status: Married    Spouse Name: N/A  . Number of Children: 2  . Years of Education: N/A   Occupational History  . nurse    Social History Main Topics  . Smoking status: Never Smoker   . Smokeless tobacco: Never Used  . Alcohol Use: Yes     Comment: occasional  . Drug Use: No  . Sexual Activity: Not on file   Other Topics Concern  . 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   07/01/2014       Past Surgical History  Procedure Laterality  Date  . Abdominal hysterectomy    . Bunionectomy with hammertoe reconstruction  2013  . Wrist reconstruction Right 2010  . Knee arthroscopy Left   . Ganglion cyst excision Left 1995    left wrist  . Colonoscopy      Family History  Problem Relation Age of Onset  . Colon cancer Neg Hx   . Esophageal cancer Neg Hx   . Rectal cancer Neg Hx   . Stomach cancer Neg Hx     No Known Allergies  Current Outpatient Prescriptions on File Prior to Visit  Medication Sig Dispense Refill  . aspirin 81 MG tablet Take 81 mg by mouth daily.    Marland Kitchen levothyroxine (SYNTHROID, LEVOTHROID) 50 MCG tablet Take 1 tablet (50 mcg total) by mouth daily. 100 tablet 3  . rizatriptan (MAXALT-MLT) 10 MG disintegrating tablet Take 1 tablet (10 mg total) by mouth as needed for migraine. May repeat in 2 hours if needed 10 tablet 10   No current facility-administered medications on file prior to visit.    BP 124/78 mmHg  Temp(Src) 97.7 F (36.5 C) (Oral)  Ht 5\' 7"  (1.702 m)  Wt        Objective:   Physical Exam  Constitutional: She is oriented to person, place, and time.  She appears well-developed and well-nourished. No distress.  Musculoskeletal: She exhibits no edema.       Left shoulder: She exhibits decreased range of motion, tenderness, bony tenderness and crepitus. She exhibits no swelling and no effusion.  Is able to bring arm out from side to about half way with pain. Is able to reach above head but has pain with doing so. Pain with cup empty test.   Neurological: She is alert and oriented to person, place, and time. She has normal reflexes.  Skin: Skin is warm and dry. No rash noted. She is not diaphoretic. No erythema. No pallor.  Psychiatric: She has a normal mood and affect. Her behavior is normal. Thought content normal.  Nursing note and vitals reviewed.     Assessment & Plan:  1. Left shoulder pain - Likely arthritic pain. There is concern for possible rotator cuff tear. Doubt bursitis at  this time - DG Shoulder Left; Future - traMADol (ULTRAM) 50 MG tablet; Take 1 tablet (50 mg total) by mouth every 8 (eight) hours as needed for moderate pain.  Dispense: 60 tablet; Refill: 0 - Consider steroid injection and/or referral to sports medicine.   Dorothyann Peng, NP

## 2015-07-19 NOTE — Telephone Encounter (Signed)
Spoke with Laura Caldwell and informed her of her x ray results. Her x ray is negative, I will send her for sports medicine consult

## 2015-08-03 ENCOUNTER — Ambulatory Visit (INDEPENDENT_AMBULATORY_CARE_PROVIDER_SITE_OTHER): Payer: 59 | Admitting: Adult Health

## 2015-08-03 ENCOUNTER — Encounter: Payer: Self-pay | Admitting: Adult Health

## 2015-08-03 VITALS — BP 124/82 | Temp 98.6°F

## 2015-08-03 DIAGNOSIS — J029 Acute pharyngitis, unspecified: Secondary | ICD-10-CM | POA: Diagnosis not present

## 2015-08-03 LAB — POCT RAPID STREP A (OFFICE): RAPID STREP A SCREEN: NEGATIVE

## 2015-08-03 MED ORDER — PENICILLIN V POTASSIUM 500 MG PO TABS: 500.0000 mg | ORAL_TABLET | Freq: Three times a day (TID) | ORAL | Status: DC

## 2015-08-03 MED ORDER — MAGIC MOUTHWASH W/LIDOCAINE: 5.0000 mL | Freq: Three times a day (TID) | ORAL | Status: DC | PRN

## 2015-08-03 NOTE — Patient Instructions (Addendum)
Your rapid strep came back negative.   I will send a culture. In the meantime, please start penicillin. Take three times a day for 10 days.   Use the magic mouth wash as directed.   Strep Throat Strep throat is a bacterial infection of the throat. Your health care provider may call the infection tonsillitis or pharyngitis, depending on whether there is swelling in the tonsils or at the back of the throat. Strep throat is most common during the cold months of the year in children who are 50-76 years of age, but it can happen during any season in people of any age. This infection is spread from person to person (contagious) through coughing, sneezing, or close contact. CAUSES Strep throat is caused by the bacteria called Streptococcus pyogenes. RISK FACTORS This condition is more likely to develop in:  People who spend time in crowded places where the infection can spread easily.  People who have close contact with someone who has strep throat. SYMPTOMS Symptoms of this condition include:  Fever or chills.   Redness, swelling, or pain in the tonsils or throat.  Pain or difficulty when swallowing.  White or yellow spots on the tonsils or throat.  Swollen, tender glands in the neck or under the jaw.  Red rash all over the body (rare). DIAGNOSIS This condition is diagnosed by performing a rapid strep test or by taking a swab of your throat (throat culture test). Results from a rapid strep test are usually ready in a few minutes, but throat culture test results are available after one or two days. TREATMENT This condition is treated with antibiotic medicine. HOME CARE INSTRUCTIONS Medicines  Take over-the-counter and prescription medicines only as told by your health care provider.  Take your antibiotic as told by your health care provider. Do not stop taking the antibiotic even if you start to feel better.  Have family members who also have a sore throat or fever tested for strep  throat. They may need antibiotics if they have the strep infection. Eating and Drinking  Do not share food, drinking cups, or personal items that could cause the infection to spread to other people.  If swallowing is difficult, try eating soft foods until your sore throat feels better.  Drink enough fluid to keep your urine clear or pale yellow. General Instructions  Gargle with a salt-water mixture 3-4 times per day or as needed. To make a salt-water mixture, completely dissolve -1 tsp of salt in 1 cup of warm water.  Make sure that all household members wash their hands well.  Get plenty of rest.  Stay home from school or work until you have been taking antibiotics for 24 hours.  Keep all follow-up visits as told by your health care provider. This is important. SEEK MEDICAL CARE IF:  The glands in your neck continue to get bigger.  You develop a rash, cough, or earache.  You cough up a thick liquid that is green, yellow-brown, or bloody.  You have pain or discomfort that does not get better with medicine.  Your problems seem to be getting worse rather than better.  You have a fever. SEEK IMMEDIATE MEDICAL CARE IF:  You have new symptoms, such as vomiting, severe headache, stiff or painful neck, chest pain, or shortness of breath.  You have severe throat pain, drooling, or changes in your voice.  You have swelling of the neck, or the skin on the neck becomes red and tender.  You have  signs of dehydration, such as fatigue, dry mouth, and decreased urination.  You become increasingly sleepy, or you cannot wake up completely.  Your joints become red or painful.   This information is not intended to replace advice given to you by your health care provider. Make sure you discuss any questions you have with your health care provider.   Document Released: 02/10/2000 Document Revised: 11/03/2014 Document Reviewed: 06/07/2014 Elsevier Interactive Patient Education [employer]

## 2015-08-03 NOTE — Progress Notes (Signed)
Subjective:    Patient ID: Laura Caldwell, female    DOB: 1960-05-26, 55 y.o.   MRN: [id number]  Sore Throat  This is a new problem. The current episode started in the past 7 days. The problem has been gradually worsening. The pain is worse on the left side. There has been no fever. Associated symptoms include swollen glands and trouble swallowing. Pertinent negatives include no abdominal pain, congestion, coughing, ear discharge, ear pain, headaches, hoarse voice or plugged ear sensation. She has tried acetaminophen for the symptoms. The treatment provided mild relief.      Review of Systems  HENT: Positive for sore throat and trouble swallowing. Negative for congestion, ear discharge, ear pain, hoarse voice, postnasal drip, rhinorrhea and voice change.   Respiratory: Negative for cough.   Gastrointestinal: Negative for abdominal pain.  Neurological: Negative for headaches.  Hematological: Positive for adenopathy.   Past Medical History  Diagnosis Date  . AUTOIMMUNE DISEASE NOT ELSEWHERE  CLASSIFIED 04/19/2009  . ASTHMA 04/26/2009  . ELEVATED BP READING WITHOUT DX HYPERTENSION 12/26/2007  . PREMATURE VENTRICULAR CONTRACTIONS 07/09/2009  . Nonalcoholic fatty liver disease 08/12/2014  . NASH (nonalcoholic steatohepatitis)   . Thyroid disease     Social History   Social History  . Marital Status: Married    Spouse Name: N/A  . Number of Children: 2  . Years of Education: N/A   Occupational History  . nurse    Social History Main Topics  . Smoking status: Never Smoker   . Smokeless tobacco: Never Used  . Alcohol Use: Yes     Comment: occasional  . Drug Use: No  . Sexual Activity: Not on file   Other Topics Concern  . 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   07/01/2014       Past Surgical History  Procedure Laterality Date  . Abdominal hysterectomy    . Bunionectomy with hammertoe reconstruction  2013  . Wrist reconstruction Right 2010  . Knee arthroscopy Left   . Ganglion cyst excision Left 1995    left wrist  . Colonoscopy      Family History  Problem Relation Age of Onset  . Colon cancer Neg Hx   . Esophageal cancer Neg Hx   . Rectal cancer Neg Hx   . Stomach cancer Neg Hx     No Known Allergies  Current Outpatient Prescriptions on File Prior to Visit  Medication Sig Dispense Refill  . aspirin 81 MG tablet Take 81 mg by mouth daily.    Marland Kitchen levothyroxine (SYNTHROID, LEVOTHROID) 50 MCG tablet Take 1 tablet (50 mcg total) by mouth daily. 100 tablet 3  . rizatriptan (MAXALT-MLT) 10 MG disintegrating tablet Take 1 tablet (10 mg total) by mouth as needed for migraine. May repeat in 2 hours if needed 10 tablet 10  . traMADol (ULTRAM) 50 MG tablet Take 1 tablet (50 mg total) by mouth every 8 (eight) hours as needed for moderate pain. 60 tablet 0   No current facility-administered medications on file prior to visit.    BP 124/82 mmHg  Temp(Src) 98.6 F (37 C) (Oral)  Wt  Objective:   Physical Exam  Constitutional: She is oriented to person, place, and time. She appears well-developed and well-nourished. No distress.  HENT:  Head: Normocephalic and atraumatic.  Right Ear: External ear normal.  Left Ear: External ear normal.  Nose: Nose normal.  Mouth/Throat: Uvula is midline and mucous membranes are normal. Oropharyngeal exudate, posterior oropharyngeal edema and posterior oropharyngeal erythema present. No tonsillar abscesses.    Neck: Neck supple. No thyromegaly present.  Cardiovascular: Normal rate, regular rhythm, normal heart sounds and intact distal pulses.  Exam reveals no gallop and no friction rub.   No murmur heard. Pulmonary/Chest: Effort normal and breath  sounds normal. No respiratory distress. She has no wheezes. She has no rales. She exhibits no tenderness.  Lymphadenopathy:       Head (right side): Submental, tonsillar and posterior auricular adenopathy present.       Head (left side): Submental, tonsillar and posterior auricular adenopathy present.    She has cervical adenopathy.  Neurological: She is alert and oriented to person, place, and time.  Skin: Skin is warm and dry. No rash noted. She is not diaphoretic. No erythema. No pallor.  Psychiatric: She has a normal mood and affect. Her behavior is normal. Judgment and thought content normal.  Nursing note and vitals reviewed.     Assessment & Plan:  1. Sore throat - POC Rapid Strep A- Negative  - I am going to treat due to symptoms. She has hypertrophic tonsils, exudate on tonsils, and swollen lymph nodes.  - magic mouthwash w/lidocaine SOLN; Take 5 mLs by mouth 3 (three) times daily as needed.  Dispense: 180 mL; Refill: 0 - penicillin v potassium (VEETID) 500 MG tablet; Take 1 tablet (500 mg total) by mouth 3 (three) times daily.  Dispense: 30 tablet; Refill: 0 - Culture, Group A Strep - Will follow up with patient after culture comes back and if needed can stop antibiotics at that time.   Dorothyann Peng, NP

## 2015-08-04 ENCOUNTER — Ambulatory Visit: Payer: 59 | Admitting: Family Medicine

## 2015-08-05 LAB — CULTURE, GROUP A STREP: ORGANISM ID, BACTERIA: NORMAL

## 2016-02-06 ENCOUNTER — Other Ambulatory Visit: Payer: 59

## 2016-02-08 ENCOUNTER — Other Ambulatory Visit (INDEPENDENT_AMBULATORY_CARE_PROVIDER_SITE_OTHER): Payer: 59

## 2016-02-08 DIAGNOSIS — Z0000 Encounter for general adult medical examination without abnormal findings: Secondary | ICD-10-CM | POA: Diagnosis not present

## 2016-02-08 LAB — LIPID PANEL
CHOLESTEROL: 289 mg/dL — AB (ref 0–200)
HDL: 76.1 mg/dL (ref >39.00)
LDL CALC: 175 mg/dL — AB (ref 0–99)
NonHDL: 213.1
Total CHOL/HDL Ratio: 4
Triglycerides: 189 mg/dL — ABNORMAL HIGH (ref 0.0–149.0)
VLDL: 37.8 mg/dL (ref 0.0–40.0)

## 2016-02-08 LAB — CBC WITH DIFFERENTIAL/PLATELET
BASOS PCT: 0.8 % (ref 0.0–3.0)
Basophils Absolute: 0 10*3/uL (ref 0.0–0.1)
EOS PCT: 1.2 % (ref 0.0–5.0)
Eosinophils Absolute: 0.1 10*3/uL (ref 0.0–0.7)
HEMATOCRIT: 42.5 % (ref 36.0–46.0)
HEMOGLOBIN: 14.6 g/dL (ref 12.0–15.0)
LYMPHS PCT: 24.2 % (ref 12.0–46.0)
Lymphs Abs: 1.1 10*3/uL (ref 0.7–4.0)
MCHC: 34.3 g/dL (ref 30.0–36.0)
MCV: 90.3 fl (ref 78.0–100.0)
MONOS PCT: 6.4 % (ref 3.0–12.0)
Monocytes Absolute: 0.3 10*3/uL (ref 0.1–1.0)
Neutro Abs: 3 10*3/uL (ref 1.4–7.7)
Neutrophils Relative %: 67.4 % (ref 43.0–77.0)
Platelets: 290 10*3/uL (ref 150.0–400.0)
RBC: 4.7 Mil/uL (ref 3.87–5.11)
RDW: 14.3 % (ref 11.5–15.5)
WBC: 4.4 10*3/uL (ref 4.0–10.5)

## 2016-02-08 LAB — HEPATIC FUNCTION PANEL
ALBUMIN: 4.6 g/dL (ref 3.5–5.2)
ALT: 26 U/L (ref 0–35)
AST: 22 U/L (ref 0–37)
Alkaline Phosphatase: 92 U/L (ref 39–117)
Bilirubin, Direct: 0.1 mg/dL (ref 0.0–0.3)
TOTAL PROTEIN: 7.4 g/dL (ref 6.0–8.3)
Total Bilirubin: 0.6 mg/dL (ref 0.2–1.2)

## 2016-02-08 LAB — POC URINALSYSI DIPSTICK (AUTOMATED)
Bilirubin, UA: NEGATIVE
Blood, UA: NEGATIVE
Glucose, UA: NEGATIVE
Ketones, UA: NEGATIVE
NITRITE UA: NEGATIVE
PH UA: 5.5
Protein, UA: NEGATIVE
Spec Grav, UA: 1.015
UROBILINOGEN UA: 0.2

## 2016-02-08 LAB — TSH: TSH: 1.71 u[IU]/mL (ref 0.35–4.50)

## 2016-02-08 LAB — BASIC METABOLIC PANEL
BUN: 18 mg/dL (ref 6–23)
CHLORIDE: 100 meq/L (ref 96–112)
CO2: 30 mEq/L (ref 19–32)
Calcium: 9.8 mg/dL (ref 8.4–10.5)
Creatinine, Ser: 0.94 mg/dL (ref 0.40–1.20)
GFR: 65.19 mL/min (ref >60.00)
Glucose, Bld: 97 mg/dL (ref 70–99)
Potassium: 3.8 mEq/L (ref 3.5–5.1)
SODIUM: 138 meq/L (ref 135–145)

## 2016-02-14 ENCOUNTER — Other Ambulatory Visit: Payer: Self-pay | Admitting: Emergency Medicine

## 2016-02-14 ENCOUNTER — Ambulatory Visit (INDEPENDENT_AMBULATORY_CARE_PROVIDER_SITE_OTHER): Payer: 59 | Admitting: Family Medicine

## 2016-02-14 ENCOUNTER — Encounter: Payer: Self-pay | Admitting: Family Medicine

## 2016-02-14 VITALS — BP 130/82 | HR 82 | Temp 98.1°F | Ht 67.0 in

## 2016-02-14 DIAGNOSIS — E039 Hypothyroidism, unspecified: Secondary | ICD-10-CM | POA: Diagnosis not present

## 2016-02-14 DIAGNOSIS — Z6833 Body mass index (BMI) 33.0-33.9, adult: Secondary | ICD-10-CM

## 2016-02-14 DIAGNOSIS — N951 Menopausal and female climacteric states: Secondary | ICD-10-CM | POA: Diagnosis not present

## 2016-02-14 DIAGNOSIS — M1990 Unspecified osteoarthritis, unspecified site: Secondary | ICD-10-CM | POA: Diagnosis not present

## 2016-02-14 DIAGNOSIS — K760 Fatty (change of) liver, not elsewhere classified: Secondary | ICD-10-CM | POA: Diagnosis not present

## 2016-02-14 DIAGNOSIS — Z23 Encounter for immunization: Secondary | ICD-10-CM

## 2016-02-14 DIAGNOSIS — E6609 Other obesity due to excess calories: Secondary | ICD-10-CM

## 2016-02-14 DIAGNOSIS — H6123 Impacted cerumen, bilateral: Secondary | ICD-10-CM

## 2016-02-14 DIAGNOSIS — M25512 Pain in left shoulder: Secondary | ICD-10-CM

## 2016-02-14 MED ORDER — TRAMADOL HCL 50 MG PO TABS: 50.0000 mg | ORAL_TABLET | Freq: Three times a day (TID) | ORAL | 0 refills | Status: DC | PRN

## 2016-02-14 MED ORDER — TRIAMTERENE-HCTZ 37.5-25 MG PO TABS: 1.0000 | ORAL_TABLET | Freq: Every day | ORAL | 6 refills | Status: DC

## 2016-02-14 MED ORDER — ESTROGENS, CONJUGATED 0.625 MG/GM VA CREA: 1.0000 | TOPICAL_CREAM | Freq: Every day | VAGINAL | 12 refills | Status: DC

## 2016-02-14 MED ORDER — LEVOTHYROXINE SODIUM 50 MCG PO TABS: 50.0000 ug | ORAL_TABLET | Freq: Every day | ORAL | 4 refills | Status: DC

## 2016-02-14 NOTE — Progress Notes (Signed)
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 TSH is normal  Her migraine headaches which have decreased in frequency and severity over the last couple years and she went to menopause she takes Maxalt 10 mg when necessary and tramadol if she has breakthrough pain  She uses Maxide 25 daily because of peripheral edema.  She's on turmeric OTC 1000 mg daily as an anti-inflammatory.  She gets routine eye care, dental care, BSE monthly, annual mammography, colonoscopy 2015 normal.  Vaccinations up-to-date seasonal flu shot given today  Weight is elevated. She was down markedly with diet and exercise is gotten off about purchase can restart her diet and exercise program after the holidays  She's having difficulty with the left shoulder. She does not recall any specific trauma but when she was acute she did have a lot of falls from horses. She had decreased range of motion. She was sent for x-rays which were normal last year and saw [facility] orthopedist. They did an injection and she did some home PT which really hasn't helped. Now she is developing symptoms consistent with a frozen shoulder.  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 One ovary left but is  nonfunctional. She tried HRT a while back but it really didn't help. She would like to cream but declines the oral estrogen.  Impression healthy female  #2 performed edema continue Maxide  #3 hypothyroidism.....Marland Kitchen continue Synthroid  Number for migraine headaches..... Continue Maxalt No.  #5 obesity......... back on diet exercise and weight loss. Follow-up labs in 6 months.

## 2016-02-14 NOTE — Patient Instructions (Signed)
Fasting lipid panel the first week in April.......... I will call you the report  Resume your diet and exercise program  Continue other meds  Small amounts apparent vaginal cream twice weekly

## 2016-02-14 NOTE — Progress Notes (Signed)
Pre visit review using our clinic review tool, if applicable. No additional management support is needed unless otherwise documented below in the visit note. 

## 2016-02-25 ENCOUNTER — Other Ambulatory Visit: Payer: Self-pay | Admitting: Family Medicine

## 2016-02-25 DIAGNOSIS — N951 Menopausal and female climacteric states: Secondary | ICD-10-CM

## 2016-02-25 DIAGNOSIS — R7989 Other specified abnormal findings of blood chemistry: Secondary | ICD-10-CM

## 2016-02-25 DIAGNOSIS — R0789 Other chest pain: Secondary | ICD-10-CM

## 2016-02-25 DIAGNOSIS — D2370 Other benign neoplasm of skin of unspecified lower limb, including hip: Secondary | ICD-10-CM

## 2016-02-25 DIAGNOSIS — J301 Allergic rhinitis due to pollen: Secondary | ICD-10-CM

## 2016-02-25 DIAGNOSIS — E039 Hypothyroidism, unspecified: Secondary | ICD-10-CM

## 2016-02-25 DIAGNOSIS — J45909 Unspecified asthma, uncomplicated: Secondary | ICD-10-CM

## 2016-02-25 DIAGNOSIS — R945 Abnormal results of liver function studies: Secondary | ICD-10-CM

## 2016-02-25 DIAGNOSIS — I872 Venous insufficiency (chronic) (peripheral): Secondary | ICD-10-CM

## 2016-02-25 DIAGNOSIS — M1990 Unspecified osteoarthritis, unspecified site: Secondary | ICD-10-CM

## 2016-02-25 DIAGNOSIS — R195 Other fecal abnormalities: Secondary | ICD-10-CM

## 2016-02-25 DIAGNOSIS — R030 Elevated blood-pressure reading, without diagnosis of hypertension: Secondary | ICD-10-CM

## 2016-02-25 DIAGNOSIS — Z820 Family history of epilepsy and other diseases of the nervous system: Secondary | ICD-10-CM

## 2016-04-02 ENCOUNTER — Telehealth: Payer: Self-pay | Admitting: Family Medicine

## 2016-04-02 MED ORDER — PREDNISONE 20 MG PO TABS: 20.0000 mg | ORAL_TABLET | Freq: Every day | ORAL | 0 refills | Status: DC

## 2016-04-02 NOTE — Telephone Encounter (Signed)
Pt has chronic back problem it is totally out and not get in the car to come up here.  Pt would like to have Rx prednisone.   Pharm:  [facility]

## 2016-04-02 NOTE — Telephone Encounter (Signed)
Spoke with pt to inform her the rx she requested has been sent in.

## 2016-04-28 ENCOUNTER — Other Ambulatory Visit: Payer: Self-pay | Admitting: Family Medicine

## 2016-04-30 ENCOUNTER — Telehealth: Payer: Self-pay | Admitting: Family Medicine

## 2016-04-30 NOTE — Telephone Encounter (Signed)
° ° ° ° ° °  Pt request refill of the following:   predniSONE (DELTASONE) 20 MG tablet   [facility]

## 2016-04-30 NOTE — Telephone Encounter (Signed)
Duplicate Request.  Was sent in today.

## 2016-06-15 ENCOUNTER — Other Ambulatory Visit: Payer: Self-pay | Admitting: Family Medicine

## 2016-06-15 DIAGNOSIS — E785 Hyperlipidemia, unspecified: Secondary | ICD-10-CM

## 2016-06-18 ENCOUNTER — Other Ambulatory Visit (INDEPENDENT_AMBULATORY_CARE_PROVIDER_SITE_OTHER): Payer: 59

## 2016-06-18 DIAGNOSIS — E785 Hyperlipidemia, unspecified: Secondary | ICD-10-CM

## 2016-06-18 LAB — LIPID PANEL
CHOL/HDL RATIO: 4
Cholesterol: 263 mg/dL — ABNORMAL HIGH (ref 0–200)
HDL: 71 mg/dL (ref >39.00)
LDL Cholesterol: 165 mg/dL — ABNORMAL HIGH (ref 0–99)
NONHDL: 192
Triglycerides: 136 mg/dL (ref 0.0–149.0)
VLDL: 27.2 mg/dL (ref 0.0–40.0)

## 2016-10-01 ENCOUNTER — Other Ambulatory Visit: Payer: Self-pay | Admitting: Family Medicine

## 2016-10-01 DIAGNOSIS — G43109 Migraine with aura, not intractable, without status migrainosus: Secondary | ICD-10-CM

## 2016-11-16 ENCOUNTER — Encounter: Payer: Self-pay | Admitting: Family Medicine

## 2017-03-10 ENCOUNTER — Other Ambulatory Visit: Payer: Self-pay | Admitting: Family Medicine

## 2017-03-11 ENCOUNTER — Other Ambulatory Visit: Payer: Self-pay | Admitting: Family Medicine

## 2017-03-11 DIAGNOSIS — Z1231 Encounter for screening mammogram for malignant neoplasm of breast: Secondary | ICD-10-CM

## 2017-04-17 ENCOUNTER — Ambulatory Visit (INDEPENDENT_AMBULATORY_CARE_PROVIDER_SITE_OTHER): Payer: 59 | Admitting: Family Medicine

## 2017-04-17 ENCOUNTER — Ambulatory Visit
Admission: RE | Admit: 2017-04-17 | Discharge: 2017-04-17 | Disposition: A | Discharge status: Home / Self Care | Payer: 59 | Source: Ambulatory Visit | Attending: Family Medicine | Admitting: Family Medicine

## 2017-04-17 ENCOUNTER — Encounter: Payer: Self-pay | Admitting: Family Medicine

## 2017-04-17 VITALS — BP 128/80 | HR 74 | Temp 98.4°F | Ht 67.0 in

## 2017-04-17 DIAGNOSIS — Z1231 Encounter for screening mammogram for malignant neoplasm of breast: Secondary | ICD-10-CM

## 2017-04-17 DIAGNOSIS — I872 Venous insufficiency (chronic) (peripheral): Secondary | ICD-10-CM

## 2017-04-17 DIAGNOSIS — G43109 Migraine with aura, not intractable, without status migrainosus: Secondary | ICD-10-CM | POA: Insufficient documentation

## 2017-04-17 DIAGNOSIS — Z6833 Body mass index (BMI) 33.0-33.9, adult: Secondary | ICD-10-CM | POA: Diagnosis not present

## 2017-04-17 DIAGNOSIS — Z0000 Encounter for general adult medical examination without abnormal findings: Secondary | ICD-10-CM | POA: Diagnosis not present

## 2017-04-17 DIAGNOSIS — E6609 Other obesity due to excess calories: Secondary | ICD-10-CM | POA: Diagnosis not present

## 2017-04-17 DIAGNOSIS — E039 Hypothyroidism, unspecified: Secondary | ICD-10-CM

## 2017-04-17 LAB — POCT URINALYSIS DIPSTICK
Bilirubin, UA: NEGATIVE
Glucose, UA: NEGATIVE
KETONES UA: NEGATIVE
Nitrite, UA: NEGATIVE
PH UA: 6 (ref 5.0–8.0)
RBC UA: NEGATIVE
Spec Grav, UA: 1.025 (ref 1.010–1.025)
UROBILINOGEN UA: 0.2 U/dL

## 2017-04-17 LAB — LIPID PANEL
CHOLESTEROL: 198 mg/dL (ref 0–200)
HDL: 62.5 mg/dL (ref >39.00)
LDL CALC: 117 mg/dL — AB (ref 0–99)
NonHDL: 135.78
TRIGLYCERIDES: 93 mg/dL (ref 0.0–149.0)
Total CHOL/HDL Ratio: 3
VLDL: 18.6 mg/dL (ref 0.0–40.0)

## 2017-04-17 LAB — CBC WITH DIFFERENTIAL/PLATELET
BASOS ABS: 0 10*3/uL (ref 0.0–0.1)
Basophils Relative: 1.1 % (ref 0.0–3.0)
EOS PCT: 1.1 % (ref 0.0–5.0)
Eosinophils Absolute: 0 10*3/uL (ref 0.0–0.7)
HEMATOCRIT: 40.4 % (ref 36.0–46.0)
Hemoglobin: 13.6 g/dL (ref 12.0–15.0)
LYMPHS ABS: 0.9 10*3/uL (ref 0.7–4.0)
LYMPHS PCT: 25.3 % (ref 12.0–46.0)
MCHC: 33.7 g/dL (ref 30.0–36.0)
MCV: 89.1 fl (ref 78.0–100.0)
MONOS PCT: 6.4 % (ref 3.0–12.0)
Monocytes Absolute: 0.2 10*3/uL (ref 0.1–1.0)
NEUTROS ABS: 2.5 10*3/uL (ref 1.4–7.7)
NEUTROS PCT: 66.1 % (ref 43.0–77.0)
PLATELETS: 257 10*3/uL (ref 150.0–400.0)
RBC: 4.53 Mil/uL (ref 3.87–5.11)
RDW: 13.4 % (ref 11.5–15.5)
WBC: 3.8 10*3/uL — ABNORMAL LOW (ref 4.0–10.5)

## 2017-04-17 LAB — HEPATIC FUNCTION PANEL
ALT: 34 U/L (ref 0–35)
AST: 29 U/L (ref 0–37)
Albumin: 4.4 g/dL (ref 3.5–5.2)
Alkaline Phosphatase: 79 U/L (ref 39–117)
BILIRUBIN TOTAL: 0.4 mg/dL (ref 0.2–1.2)
Bilirubin, Direct: 0.1 mg/dL (ref 0.0–0.3)
Total Protein: 7.1 g/dL (ref 6.0–8.3)

## 2017-04-17 LAB — BASIC METABOLIC PANEL
BUN: 14 mg/dL (ref 6–23)
CHLORIDE: 100 meq/L (ref 96–112)
CO2: 32 mEq/L (ref 19–32)
Calcium: 9.8 mg/dL (ref 8.4–10.5)
Creatinine, Ser: 0.99 mg/dL (ref 0.40–1.20)
GFR: 61.15 mL/min (ref >60.00)
Glucose, Bld: 97 mg/dL (ref 70–99)
POTASSIUM: 3.5 meq/L (ref 3.5–5.1)
SODIUM: 139 meq/L (ref 135–145)

## 2017-04-17 LAB — TSH: TSH: 2.51 u[IU]/mL (ref 0.35–4.50)

## 2017-04-17 MED ORDER — RIZATRIPTAN BENZOATE 10 MG PO TBDP: 10.0000 mg | ORAL_TABLET | ORAL | 1 refills | Status: DC | PRN

## 2017-04-17 MED ORDER — TRIAMTERENE-HCTZ 37.5-25 MG PO TABS: 1.0000 | ORAL_TABLET | Freq: Every day | ORAL | 4 refills | Status: DC

## 2017-04-17 MED ORDER — LEVOTHYROXINE SODIUM 50 MCG PO TABS: 50.0000 ug | ORAL_TABLET | Freq: Every day | ORAL | 4 refills | Status: DC

## 2017-04-17 NOTE — Patient Instructions (Signed)
Labs today......... I will call a physician anything abnormal  Continue good diet and exercise program!!!!!!!!!!!!!!!!!!!!!!  Follow-up in one year sooner if any problems  Check with your insurance company about the shingles vaccine.  Walk 30 minutes daily  Spent 15 minutes every morning doing those back stretching yoga type exercises

## 2017-04-17 NOTE — Progress Notes (Signed)
Laura Caldwell is a 57 year old married female nonsmoker who works for [employer] in the [place] the [place] ,,,,,,,, living on her boat at [place]........ who comes in today for a new physical exam because of history of venous insufficiency  and hypothyroidism  Her blood pressure has been under good control with . BP today 120/80  She spent on a diet for the past 6 weeks plus and his lost weight. She declines getting on the scales.  She had surgery on both shoulders this past year by Dr. Percell Miller.  She also has a history of hypothyroidism. She is on Synthroid 50 g daily.  She also has a history of migraines and takes Maxalt when necessary. However the migraines decreased in frequency and severity.  She was using Premarin cream for postmenopausal vaginal dryness but states she doesn't need it anymore  She gets routine eye care, dental care, BSE monthly, annual mammography, colonoscopy up-to-date  Vaccinations up-to-date information given on shingles.  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 2 sons one who is grown and gone one is still at home but graduating from [employer]  BP 128/80 (BP Location: Left Arm, Patient Position: Sitting, Cuff Size: Normal)   Pulse 74   Temp 98.4 F (36.9 C) (Oral)   Ht 5\' 7"  (1.702 m)   BMI 33.67 kg/m  Well-developed well-nourished female no acute distress vital signs stable she's afebrile HEENT were negative neck was supple no adenopathy no carotid bruits cardiopulmonary exam normal breast exam normal abdominal exam normal except for scar lower midline abdomen from previous hysterectomy. One ovary was left intact. She's asymptomatic. Therefore pelvic exams not indicated. Extremities normal skin normal peripheral pulses normal except for the chronic venous insufficiency of her lower extremities.  #1 hypertension at goal ............ continue current med check labs  #2 hypothyroidism  .......... continue Synthroid check labs  #3 overweight ........... continue diet exercise and weight loss. ,,,,,,,,,

## 2017-04-22 ENCOUNTER — Other Ambulatory Visit: Payer: Self-pay | Admitting: Family Medicine

## 2017-04-22 ENCOUNTER — Encounter: Payer: Self-pay | Admitting: Family Medicine

## 2017-04-22 DIAGNOSIS — R739 Hyperglycemia, unspecified: Secondary | ICD-10-CM

## 2017-09-12 ENCOUNTER — Other Ambulatory Visit: Payer: Self-pay | Admitting: Family Medicine

## 2017-09-12 DIAGNOSIS — G43109 Migraine with aura, not intractable, without status migrainosus: Secondary | ICD-10-CM

## 2017-09-12 NOTE — Telephone Encounter (Signed)
This is Dr Sherren Mocha pt last OV was 04/17/2017 for 10 tab with 1 refill, pt requests for more refills, please Advise if ok to refill

## 2017-09-16 NOTE — Telephone Encounter (Signed)
OK for RF 

## 2018-02-27 ENCOUNTER — Other Ambulatory Visit: Payer: Self-pay | Admitting: *Deleted

## 2018-02-27 DIAGNOSIS — G43109 Migraine with aura, not intractable, without status migrainosus: Secondary | ICD-10-CM

## 2018-02-27 MED ORDER — RIZATRIPTAN BENZOATE 10 MG PO TBDP: 10.0000 mg | ORAL_TABLET | ORAL | 1 refills | Status: DC | PRN

## 2018-04-01 ENCOUNTER — Telehealth: Payer: Self-pay | Admitting: Family Medicine

## 2018-04-01 NOTE — Telephone Encounter (Signed)
Please help get pt scheduled.  Thanks!!

## 2018-04-01 NOTE — Telephone Encounter (Signed)
Ok for TOC. 

## 2018-04-01 NOTE — Telephone Encounter (Signed)
Copied from [place] [phone]. 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 She is calling in to schedule a TOC ov with Tommi Rumps. I advised that [facility] isn't currently taking TOC patients. She stated that Tommi Rumps is aware of who she is and that he also see a few of her family members. She would like to know if Tommi Rumps would consider seeing her as her new PCP?   Please advise.

## 2018-04-08 NOTE — Telephone Encounter (Signed)
Pt has been scheduled.  °

## 2018-04-23 ENCOUNTER — Other Ambulatory Visit: Payer: Self-pay | Admitting: *Deleted

## 2018-04-23 MED ORDER — LEVOTHYROXINE SODIUM 50 MCG PO TABS: 50.0000 ug | ORAL_TABLET | Freq: Every day | ORAL | 0 refills | Status: DC

## 2018-04-25 ENCOUNTER — Other Ambulatory Visit: Payer: Self-pay | Admitting: *Deleted

## 2018-04-25 MED ORDER — TRIAMTERENE-HCTZ 37.5-25 MG PO TABS: 1.0000 | ORAL_TABLET | Freq: Every day | ORAL | 0 refills | Status: DC

## 2018-05-02 ENCOUNTER — Ambulatory Visit (INDEPENDENT_AMBULATORY_CARE_PROVIDER_SITE_OTHER): Payer: 59 | Admitting: Adult Health

## 2018-05-02 ENCOUNTER — Encounter: Payer: Self-pay | Admitting: Adult Health

## 2018-05-02 VITALS — BP 140/90 | Temp 98.7°F | Wt 216.0 lb

## 2018-05-02 DIAGNOSIS — G8929 Other chronic pain: Secondary | ICD-10-CM | POA: Diagnosis not present

## 2018-05-02 DIAGNOSIS — M5441 Lumbago with sciatica, right side: Secondary | ICD-10-CM | POA: Diagnosis not present

## 2018-05-02 DIAGNOSIS — G43109 Migraine with aura, not intractable, without status migrainosus: Secondary | ICD-10-CM | POA: Diagnosis not present

## 2018-05-02 DIAGNOSIS — Z0000 Encounter for general adult medical examination without abnormal findings: Secondary | ICD-10-CM

## 2018-05-02 DIAGNOSIS — E039 Hypothyroidism, unspecified: Secondary | ICD-10-CM | POA: Diagnosis not present

## 2018-05-02 LAB — LIPID PANEL
Cholesterol: 250 mg/dL — ABNORMAL HIGH (ref 0–200)
HDL: 72.3 mg/dL (ref >39.00)
LDL Cholesterol: 149 mg/dL — ABNORMAL HIGH (ref 0–99)
NonHDL: 177.73
Total CHOL/HDL Ratio: 3
Triglycerides: 142 mg/dL (ref 0.0–149.0)
VLDL: 28.4 mg/dL (ref 0.0–40.0)

## 2018-05-02 LAB — CBC WITH DIFFERENTIAL/PLATELET
Basophils Absolute: 0.1 10*3/uL (ref 0.0–0.1)
Basophils Relative: 1.1 % (ref 0.0–3.0)
Eosinophils Absolute: 0.1 10*3/uL (ref 0.0–0.7)
Eosinophils Relative: 1.7 % (ref 0.0–5.0)
HCT: 42.4 % (ref 36.0–46.0)
Hemoglobin: 14.5 g/dL (ref 12.0–15.0)
LYMPHS ABS: 1 10*3/uL (ref 0.7–4.0)
Lymphocytes Relative: 22.9 % (ref 12.0–46.0)
MCHC: 34.3 g/dL (ref 30.0–36.0)
MCV: 90.4 fl (ref 78.0–100.0)
Monocytes Absolute: 0.3 10*3/uL (ref 0.1–1.0)
Monocytes Relative: 6.1 % (ref 3.0–12.0)
NEUTROS PCT: 68.2 % (ref 43.0–77.0)
Neutro Abs: 3.1 10*3/uL (ref 1.4–7.7)
Platelets: 273 10*3/uL (ref 150.0–400.0)
RBC: 4.68 Mil/uL (ref 3.87–5.11)
RDW: 13.6 % (ref 11.5–15.5)
WBC: 4.5 10*3/uL (ref 4.0–10.5)

## 2018-05-02 LAB — COMPREHENSIVE METABOLIC PANEL
ALT: 26 U/L (ref 0–35)
AST: 21 U/L (ref 0–37)
Albumin: 4.8 g/dL (ref 3.5–5.2)
Alkaline Phosphatase: 104 U/L (ref 39–117)
BUN: 22 mg/dL (ref 6–23)
CO2: 29 mEq/L (ref 19–32)
Calcium: 10.1 mg/dL (ref 8.4–10.5)
Chloride: 100 mEq/L (ref 96–112)
Creatinine, Ser: 0.98 mg/dL (ref 0.40–1.20)
GFR: 58 mL/min — ABNORMAL LOW (ref >60.00)
Glucose, Bld: 94 mg/dL (ref 70–99)
Potassium: 3.9 mEq/L (ref 3.5–5.1)
Sodium: 138 mEq/L (ref 135–145)
Total Bilirubin: 0.5 mg/dL (ref 0.2–1.2)
Total Protein: 7.6 g/dL (ref 6.0–8.3)

## 2018-05-02 LAB — TSH: TSH: 3.61 u[IU]/mL (ref 0.35–4.50)

## 2018-05-02 MED ORDER — PREDNISONE 10 MG PO TABS: ORAL_TABLET | ORAL | 0 refills | Status: DC

## 2018-05-02 NOTE — Progress Notes (Signed)
Patient presents to clinic today to establish care. She is a pleasant 58 year old female who  has a past medical history of ASTHMA (04/26/2009), AUTOIMMUNE DISEASE NOT ELSEWHERE  CLASSIFIED (04/19/2009), ELEVATED BP READING WITHOUT DX HYPERTENSION (12/26/2007), NASH (nonalcoholic steatohepatitis), Nonalcoholic fatty liver disease (08/12/2014), PREMATURE VENTRICULAR CONTRACTIONS (07/09/2009), and Thyroid disease.   Acute Concerns: Establish Care/CPE  Chronic Issues: Essential Hypertension -Controlled with Maxide 25.  She denies chest pain, shortness of breath, dizziness, lightheadedness or syncopal episodes BP Readings from Last 3 Encounters:  05/02/18 140/90  04/17/17 128/80  02/14/16 130/82    Hypothyroidism -Synthroid 50 mcg. Lab Results  Component Value Date   TSH 2.51 04/17/2017   Migraines -uses Maxalt as needed. She does not take this often.   Chronic Low Back Pain - last MRI was in 2012 which showed  1.  Stable facet disease at L3-4 and slight disc bulging without significant stenosis. 2.  Stable mild foraminal narrowing at L4-5, worse on the left. There is some distortion of the central canal without significant central stenosis. 3.  Slight progression of central and right paracentral disc protrusion at L5-S1 with potential contact of the right S1 nerve root. 4.  Similar appearance of left greater than right foraminal stenosis at L5-S1.  She reports that about 3-4 weeks ago she started to experience right sided low back pain with burning/numbness sensation down her right leg past her knee. Sitting for extended periods of and walking long distances are causative factors. She has been doing stretching exercises and reports some improvement in her low back/buttock pain but continues to have a numbness sensation to her right lower extremity    Health Maintenance: Dental -- Routine Care- Dr. Darrick Grinder  Vision -- Routine Care- Dr. Nicki Reaper Immunizations -- Refuses flu vaccination    Colonoscopy -- UTD- next in 2025 Mammogram -- UTD PAP -- no longer needs. Has hysterectomy  Bone Density -- never had  Exercise- Walks  Diet- Low Carb/Slow sugar    Past Medical History:  Diagnosis Date  . ASTHMA 04/26/2009  . AUTOIMMUNE DISEASE NOT ELSEWHERE  CLASSIFIED 04/19/2009  . ELEVATED BP READING WITHOUT DX HYPERTENSION 12/26/2007  . NASH (nonalcoholic steatohepatitis)   . Nonalcoholic fatty liver disease 08/12/2014  . PREMATURE VENTRICULAR CONTRACTIONS 07/09/2009  . Thyroid disease     Past Surgical History:  Procedure Laterality Date  . ABDOMINAL HYSTERECTOMY    . BUNIONECTOMY WITH HAMMERTOE RECONSTRUCTION  2013  . COLONOSCOPY    . GANGLION CYST EXCISION Left 1995   left wrist  . KNEE ARTHROSCOPY Left   . WRIST RECONSTRUCTION Right 2010    Current Outpatient Medications on File Prior to Visit  Medication Sig Dispense Refill  . levothyroxine (SYNTHROID, LEVOTHROID) 50 MCG tablet Take 1 tablet (50 mcg total) by mouth daily. 90 tablet 0  . rizatriptan (MAXALT-MLT) 10 MG disintegrating tablet Take 1 tablet (10 mg total) by mouth as needed for migraine. May repeat in 2 hours if needed 4 tablet 1  . traMADol (ULTRAM) 50 MG tablet Take 1 tablet (50 mg total) by mouth every 8 (eight) hours as needed for moderate pain. 60 tablet 0  . triamterene-hydrochlorothiazide (MAXZIDE-25) 37.5-25 MG tablet Take 1 tablet by mouth daily. 90 tablet 0   No current facility-administered medications on file prior to visit.     No Known Allergies  Family History  Problem Relation Age of Onset  . Colon cancer Neg Hx   . Esophageal cancer Neg Hx   .  Rectal cancer Neg Hx   . Stomach cancer Neg Hx   . Breast cancer Neg Hx     Social History   Socioeconomic History  . Marital status: Married    Spouse name: Not on file  . Number of children: 2  . Years of education: Not on file  . Highest education level: Not on file  Occupational History  . Occupation: nurse  Social Needs  .  Financial resource strain: Not on file  . Food insecurity:    Worry: Not on file    Inability: Not on file  . Transportation needs:    Medical: Not on file    Non-medical: Not on file  Tobacco Use  . Smoking status: Never Smoker  . Smokeless tobacco: Never Used  Substance and Sexual Activity  . Alcohol use: Yes    Comment: occasional  . Drug use: No  . Sexual activity: Not on file  Lifestyle  . Physical activity:    Days per week: Not on file    Minutes per session: Not on file  . Stress: Not on file  Relationships  . Social connections:    Talks on phone: Not on file    Gets together: Not on file    Attends religious service: Not on file    Active member of club or organization: Not on file    Attends meetings of clubs or organizations: Not on file    Relationship status: Not on file  . Intimate partner violence:    Fear of current or ex partner: Not on file    Emotionally abused: Not on file    Physically abused: Not on file    Forced sexual activity: Not on file  Other Topics Concern  . 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   07/01/2014    Review of Systems  Constitutional: Negative.   HENT: Negative.   Eyes: Negative.   Respiratory: Negative.   Gastrointestinal: Negative.   Genitourinary: Negative.   Musculoskeletal: Positive for back pain.  Skin: Negative.   Neurological: Negative.   Endo/Heme/Allergies: Negative.   Psychiatric/Behavioral: Negative.   All other systems reviewed and are negative.      BP 140/90   Temp 98.7 F (37.1 C)   Wt 216 lb (98 kg)   BMI 33.83 kg/m   Physical Exam Vitals signs and nursing note reviewed.  Constitutional:      General: She is not in acute distress.     Appearance: Normal appearance. She is well-developed. She is obese.  HENT:     Head: Normocephalic and atraumatic.     Right Ear: Tympanic membrane, ear canal and external ear normal. There is no impacted cerumen.     Left Ear: Tympanic membrane, ear canal and external ear normal. There is no impacted cerumen.     Nose: Nose normal. No congestion or rhinorrhea.     Mouth/Throat:     Mouth: Mucous membranes are dry.     Pharynx: Oropharynx is clear. No oropharyngeal exudate or posterior oropharyngeal erythema.  Eyes:     General:        Right eye: No discharge.  Left eye: No discharge.     Extraocular Movements: Extraocular movements intact.     Conjunctiva/sclera: Conjunctivae normal.     Pupils: Pupils are equal, round, and reactive to light.  Neck:     Musculoskeletal: Normal range of motion and neck supple. No neck rigidity or muscular tenderness.     Thyroid: No thyromegaly.     Vascular: No carotid bruit.     Trachea: No tracheal deviation.  Cardiovascular:     Rate and Rhythm: Normal rate and regular rhythm.     Pulses: Normal pulses.     Heart sounds: Normal heart sounds. No murmur. No friction rub. No gallop.   Pulmonary:     Effort: Pulmonary effort is normal. No respiratory distress.     Breath sounds: Normal breath sounds. No stridor. No wheezing, rhonchi or rales.  Chest:     Chest wall: No tenderness.  Abdominal:     General: Abdomen is flat. Bowel sounds are normal. There is no distension.     Palpations: Abdomen is soft. There is no mass.     Tenderness: There is no abdominal tenderness. There is no right CVA tenderness, guarding or rebound.     Hernia: No hernia is present.  Musculoskeletal: Normal range of motion.        General: No swelling, tenderness, deformity or signs of injury.     Right lower leg: No edema.     Left lower leg: No edema.     Comments: No tenderness to lower back or right side sciatic nerve  Lymphadenopathy:     Cervical: No  cervical adenopathy.  Skin:    General: Skin is warm and dry.     Capillary Refill: Capillary refill takes less than 2 seconds.     Coloration: Skin is not jaundiced or pale.     Findings: No bruising, erythema, lesion or rash.  Neurological:     General: No focal deficit present.     Mental Status: She is alert and oriented to person, place, and time. Mental status is at baseline.     Cranial Nerves: No cranial nerve deficit.     Sensory: No sensory deficit.     Motor: No weakness.     Coordination: Coordination normal.     Gait: Gait normal.     Deep Tendon Reflexes: Reflexes normal.  Psychiatric:        Mood and Affect: Mood normal.        Behavior: Behavior normal.        Thought Content: Thought content normal.        Judgment: Judgment normal.    Assessment/Plan: 1. Routine general medical examination at a health care facility - Needs to work on weight loss through diet and exercise - Follow up in one year or sooner if needed - CBC with Differential/Platelet - Comprehensive metabolic panel - Lipid panel - TSH  2. Hypothyroidism, unspecified type - Consider increase in synthroid  - CBC with Differential/Platelet - Comprehensive metabolic panel - Lipid panel - TSH  3. Migraine with aura and without status migrainosus, not intractable - Continue Maxalt as directed  - CBC with Differential/Platelet - Comprehensive metabolic panel - Lipid panel - TSH  4. Chronic right-sided low back pain with right-sided sciatica - Consider referral to orthopedics - predniSONE (DELTASONE) 10 MG tablet; 40 mg x 3 days, 20 mg x 3 days, 10 mg x 3 days  Dispense: 21 tablet; Refill: 0 - MR Lumbar Spine Wo Contrast;  Future  Dorothyann Peng, NP

## 2018-05-11 ENCOUNTER — Encounter: Payer: Self-pay | Admitting: Adult Health

## 2018-05-19 NOTE — Telephone Encounter (Signed)
Pt is having mri this Wednesday, and wants to know if she can add mri of r knee as well.  She can not straighten it and it is painful.   cb is [phone]

## 2018-05-20 ENCOUNTER — Other Ambulatory Visit: Payer: Self-pay | Admitting: Adult Health

## 2018-05-20 DIAGNOSIS — M25561 Pain in right knee: Secondary | ICD-10-CM

## 2018-05-21 ENCOUNTER — Other Ambulatory Visit: Payer: 59

## 2018-05-22 ENCOUNTER — Other Ambulatory Visit: Payer: 59

## 2018-05-29 ENCOUNTER — Telehealth: Payer: Self-pay | Admitting: Adult Health

## 2018-05-29 NOTE — Telephone Encounter (Signed)
Copied from [place] [id number]. Topic: Quick Communication - See Telephone Encounter >> May 29, 2018  2:57 PM Sheran Luz wrote: CRM for notification. See Telephone encounter for: 05/29/18.  Patient calling as FYI to let PCP know that she has cancelled MRI after having to reschedule it twice. The next available appointment was in May and patient felt that was too far out. Patient also wanted to note that her back is feeling better after prednisone. Patient saw orthopedist and was given injection in knee and states that her knee is feeling better as well. Overall she does not feel like she needs MRI at this time and wanted to make PCP aware.

## 2018-05-29 NOTE — Telephone Encounter (Signed)
noted 

## 2018-06-17 ENCOUNTER — Other Ambulatory Visit: Payer: 59

## 2018-07-02 ENCOUNTER — Other Ambulatory Visit: Payer: Self-pay | Admitting: Adult Health

## 2018-07-02 NOTE — Telephone Encounter (Signed)
Pt says that she was also advised by her pharmacy that she need a Rx for triamterene-hydrochlorothiazide (MAXZIDE-25) 37.5-25 MG tablet    Pt says that Dr. Sherren Mocha use to write her scripts for a year. Pt would like to have her Rx written the same way.    Pharmacy: [id number] - [place], [place]

## 2018-07-03 NOTE — Telephone Encounter (Signed)
Sent to the pharmacy by e-scribe. 

## 2018-07-24 ENCOUNTER — Telehealth: Payer: Self-pay | Admitting: Adult Health

## 2018-07-24 DIAGNOSIS — G43109 Migraine with aura, not intractable, without status migrainosus: Secondary | ICD-10-CM

## 2018-07-24 MED ORDER — RIZATRIPTAN BENZOATE 10 MG PO TBDP: 10.0000 mg | ORAL_TABLET | ORAL | 2 refills | Status: DC | PRN

## 2018-07-24 MED ORDER — TRIAMTERENE-HCTZ 37.5-25 MG PO TABS: 1.0000 | ORAL_TABLET | Freq: Every day | ORAL | 2 refills | Status: DC

## 2018-07-24 NOTE — Telephone Encounter (Signed)
Sent to the pharmacy by e-scribe. 

## 2018-07-24 NOTE — Telephone Encounter (Signed)
Copied from [place] [phone]. Topic: Quick Communication - Rx Refill/Question >> Jul 24, 2018  9:36 AM Rutherford Nail, NT wrote: **Patient requesting a year supply. States that Dr Sherren Mocha used to give her enough refills to last her throughout the year. Would like it if Tommi Rumps could do this also.**  Medication: triamterene-hydrochlorothiazide (MAXZIDE-25) 37.5-25 MG tablet rizatriptan (MAXALT-MLT) 10 MG disintegrating tablet   Has the patient contacted their pharmacy? yes (Agent: If no, request that the patient contact the pharmacy for the refill.) (Agent: If yes, when and what did the pharmacy advise?)  Preferred Pharmacy (with phone number or street name): [id number] - [place], [address]  Agent: Please be advised that RX refills may take up to 3 business days. We ask that you follow-up with your pharmacy.

## 2019-01-16 ENCOUNTER — Telehealth: Payer: Self-pay | Admitting: *Deleted

## 2019-01-16 NOTE — Telephone Encounter (Signed)
Copied from [place] [phone]. Topic: General - Other >> Jan 16, 2019 10:49 AM Alanda Slim E wrote: Reason for CRM: Pt is scheduled to have surgery with [facility] and they will be faxing over a surgery clearance form that needs to be returned asap / please advise

## 2019-01-19 NOTE — Telephone Encounter (Signed)
Pt scheduled for 01/30/2019 @ 7 AM.  Nothing further needed.

## 2019-01-29 ENCOUNTER — Other Ambulatory Visit: Payer: Self-pay

## 2019-01-30 ENCOUNTER — Ambulatory Visit (INDEPENDENT_AMBULATORY_CARE_PROVIDER_SITE_OTHER): Payer: 59 | Admitting: Adult Health

## 2019-01-30 ENCOUNTER — Encounter: Payer: Self-pay | Admitting: Adult Health

## 2019-01-30 VITALS — BP 120/74 | Temp 97.3°F | Wt 236.0 lb

## 2019-01-30 DIAGNOSIS — Z23 Encounter for immunization: Secondary | ICD-10-CM | POA: Diagnosis not present

## 2019-01-30 DIAGNOSIS — Z01818 Encounter for other preprocedural examination: Secondary | ICD-10-CM

## 2019-01-30 LAB — CBC WITH DIFFERENTIAL/PLATELET
Basophils Absolute: 0.1 10*3/uL (ref 0.0–0.1)
Basophils Relative: 1.4 % (ref 0.0–3.0)
Eosinophils Absolute: 0.1 10*3/uL (ref 0.0–0.7)
Eosinophils Relative: 1.4 % (ref 0.0–5.0)
HCT: 41.2 % (ref 36.0–46.0)
Hemoglobin: 14.1 g/dL (ref 12.0–15.0)
Lymphocytes Relative: 27 % (ref 12.0–46.0)
Lymphs Abs: 1.3 10*3/uL (ref 0.7–4.0)
MCHC: 34.1 g/dL (ref 30.0–36.0)
MCV: 91.1 fl (ref 78.0–100.0)
Monocytes Absolute: 0.2 10*3/uL (ref 0.1–1.0)
Monocytes Relative: 4.7 % (ref 3.0–12.0)
Neutro Abs: 3 10*3/uL (ref 1.4–7.7)
Neutrophils Relative %: 65.5 % (ref 43.0–77.0)
Platelets: 273 10*3/uL (ref 150.0–400.0)
RBC: 4.52 Mil/uL (ref 3.87–5.11)
RDW: 14.7 % (ref 11.5–15.5)
WBC: 4.6 10*3/uL (ref 4.0–10.5)

## 2019-01-30 LAB — COMPREHENSIVE METABOLIC PANEL
ALT: 76 U/L — ABNORMAL HIGH (ref 0–35)
AST: 42 U/L — ABNORMAL HIGH (ref 0–37)
Albumin: 4.5 g/dL (ref 3.5–5.2)
Alkaline Phosphatase: 115 U/L (ref 39–117)
BUN: 24 mg/dL — ABNORMAL HIGH (ref 6–23)
CO2: 28 mEq/L (ref 19–32)
Calcium: 9.7 mg/dL (ref 8.4–10.5)
Chloride: 101 mEq/L (ref 96–112)
Creatinine, Ser: 1.1 mg/dL (ref 0.40–1.20)
GFR: 50.63 mL/min — ABNORMAL LOW (ref >60.00)
Glucose, Bld: 104 mg/dL — ABNORMAL HIGH (ref 70–99)
Potassium: 3.5 mEq/L (ref 3.5–5.1)
Sodium: 140 mEq/L (ref 135–145)
Total Bilirubin: 0.6 mg/dL (ref 0.2–1.2)
Total Protein: 7.2 g/dL (ref 6.0–8.3)

## 2019-01-30 LAB — PROTIME-INR
INR: 0.9 ratio (ref 0.8–1.0)
Prothrombin Time: 10.5 s (ref 9.6–13.1)

## 2019-01-30 LAB — HEMOGLOBIN A1C: Hgb A1c MFr Bld: 5.4 % (ref 4.6–6.5)

## 2019-01-30 NOTE — Addendum Note (Signed)
Addended by: Miles Costain T on: 01/30/2019 07:25 AM   Modules accepted: Orders

## 2019-01-30 NOTE — Progress Notes (Signed)
Subjective:    Patient ID: Laura Caldwell, female    DOB: 01/10/1961, 58 y.o.   MRN: [id number]  HPI  58 year old female who  has a past medical history of ASTHMA (04/26/2009), AUTOIMMUNE DISEASE NOT ELSEWHERE  CLASSIFIED (04/19/2009), ELEVATED BP READING WITHOUT DX HYPERTENSION (12/26/2007), NASH (nonalcoholic steatohepatitis), Nonalcoholic fatty liver disease (08/12/2014), PREMATURE VENTRICULAR CONTRACTIONS (07/09/2009), and Thyroid disease.  She presents to the office today for pre surgical clearance. She will be having a right total knee replacement done. Surgeon is Edmonia Lynch. She has no questions about the surgery.   She denies CP, SOB, or feeling acutely ill   Review of Systems  Constitutional: Negative.   HENT: Negative.   Respiratory: Negative.   Cardiovascular: Negative.   Gastrointestinal: Negative.   Genitourinary: Negative.   Musculoskeletal: Positive for arthralgias.  Neurological: Negative.   Hematological: Negative.   Psychiatric/Behavioral: Negative.    Past Medical History:  Diagnosis Date  . ASTHMA 04/26/2009  . AUTOIMMUNE DISEASE NOT ELSEWHERE  CLASSIFIED 04/19/2009  . ELEVATED BP READING WITHOUT DX HYPERTENSION 12/26/2007  . NASH (nonalcoholic steatohepatitis)   . Nonalcoholic fatty liver disease 08/12/2014  . PREMATURE VENTRICULAR CONTRACTIONS 07/09/2009  . Thyroid disease     Social History   Socioeconomic History  . Marital status: Married    Spouse name: Not on file  . Number of children: 2  . Years of education: Not on file  . Highest education level: Not on file  Occupational History  . Occupation: nurse  Social Needs  . Financial resource strain: Not on file  . Food insecurity    Worry: Not on file    Inability: Not on file  . Transportation needs    Medical: Not on file    Non-medical: Not on file  Tobacco Use  . Smoking status: Never Smoker  . Smokeless tobacco: Never Used  Substance and Sexual Activity  . Alcohol use: Yes   Comment: occasional  . Drug use: No  . Sexual activity: Not on file  Lifestyle  . Physical activity    Days per week: Not on file    Minutes per session: Not on file  . Stress: Not on file  Relationships  . Social Herbalist on phone: Not on file    Gets together: Not on file    Attends religious service: Not on file    Active member of club or organization: Not on file    Attends meetings of clubs or organizations: Not on file    Relationship status: Not on file  . Intimate partner violence    Fear of current or ex partner: Not on file    Emotionally abused: Not on file    Physically abused: Not on file    Forced sexual activity: Not on file  Other Topics Concern  . 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   07/01/2014    Past Surgical History:  Procedure Laterality Date  . ABDOMINAL HYSTERECTOMY    . BUNIONECTOMY WITH HAMMERTOE RECONSTRUCTION  2013  . COLONOSCOPY    . GANGLION CYST EXCISION Left 1995   left wrist  . KNEE ARTHROSCOPY Left   .  WRIST RECONSTRUCTION Right 2010    Family History  Problem Relation Age of Onset  . Colon cancer Neg Hx   . Esophageal cancer Neg Hx   . Rectal cancer Neg Hx   . Stomach cancer Neg Hx   . Breast cancer Neg Hx     No Known Allergies  Current Outpatient Medications on File Prior to Visit  Medication Sig Dispense Refill  . levothyroxine (SYNTHROID) 50 MCG tablet TAKE 1 TABLET BY MOUTH EVERY DAY 90 tablet 3  . rizatriptan (MAXALT-MLT) 10 MG disintegrating tablet Take 1 tablet (10 mg total) by mouth as needed for migraine. May repeat in 2 hours if needed 30 tablet 2  . triamterene-hydrochlorothiazide (MAXZIDE-25) 37.5-25 MG tablet Take 1 tablet by mouth daily. 90  tablet 2   No current facility-administered medications on file prior to visit.     BP 120/74   Temp (!) 97.3 F (36.3 C)   Wt 236 lb (107 kg)   BMI 36.96 kg/m       Objective:   Physical Exam Vitals signs and nursing note reviewed.  Constitutional:      Appearance: Normal appearance.  Cardiovascular:     Rate and Rhythm: Normal rate and regular rhythm.     Pulses: Normal pulses.     Heart sounds: Normal heart sounds. No murmur. No friction rub. No gallop.   Pulmonary:     Effort: Pulmonary effort is normal. No respiratory distress.     Breath sounds: Normal breath sounds. No stridor. No wheezing, rhonchi or rales.  Chest:     Chest wall: No tenderness.  Abdominal:     General: Abdomen is flat. Bowel sounds are normal.     Palpations: Abdomen is soft.  Musculoskeletal: Normal range of motion.        General: No swelling.     Right lower leg: No edema.     Left lower leg: No edema.  Skin:    General: Skin is warm and dry.  Neurological:     General: No focal deficit present.     Mental Status: She is oriented to person, place, and time.  Psychiatric:        Mood and Affect: Mood normal.        Behavior: Behavior normal.        Thought Content: Thought content normal.        Judgment: Judgment normal.       Assessment & Plan:  1. Pre-operative clearance  - EKG 12-Lead- NSR- Rate 75 - Hemoglobin A1c - CBC with Differential/Platelet - CMP - Protime-INR   Dorothyann Peng, NP

## 2019-03-19 ENCOUNTER — Other Ambulatory Visit: Payer: Self-pay

## 2019-03-19 ENCOUNTER — Other Ambulatory Visit (HOSPITAL_COMMUNITY): Payer: Self-pay | Admitting: Orthopedic Surgery

## 2019-03-19 ENCOUNTER — Ambulatory Visit (HOSPITAL_COMMUNITY)
Admission: RE | Admit: 2019-03-19 | Discharge: 2019-03-19 | Disposition: A | Discharge status: Home / Self Care | Payer: 59 | Source: Ambulatory Visit | Attending: Orthopedic Surgery | Admitting: Orthopedic Surgery

## 2019-03-19 DIAGNOSIS — M7989 Other specified soft tissue disorders: Secondary | ICD-10-CM

## 2019-03-19 DIAGNOSIS — M79604 Pain in right leg: Secondary | ICD-10-CM

## 2019-03-19 NOTE — Progress Notes (Signed)
Right lower extremity venous duplex completed.  Preliminary results can be found under CV proc under chart review.  03/19/2019 4:23 PM  Alantra Popoca, K., RDMS, RVT

## 2019-07-03 ENCOUNTER — Other Ambulatory Visit: Payer: Self-pay | Admitting: Adult Health

## 2019-07-03 NOTE — Telephone Encounter (Signed)
SENT TO THE PHARMACY BY E-SCRIBE.  PT HAS UPCOMING APPOINTMENT ON 08/07/19.

## 2019-07-06 ENCOUNTER — Telehealth: Payer: Self-pay | Admitting: Adult Health

## 2019-07-06 NOTE — Telephone Encounter (Signed)
Pt would like a referral to a rheumatology. Pt would like a referral to Dr. Dossie Der at [facility]. Pt would like all labs and all office notes fax to [phone]. Pt is aware that you are not in the office today and will return on 07/07/19. Thanks

## 2019-07-08 ENCOUNTER — Encounter: Payer: Self-pay | Admitting: Adult Health

## 2019-07-08 ENCOUNTER — Other Ambulatory Visit: Payer: Self-pay | Admitting: Adult Health

## 2019-07-08 DIAGNOSIS — M2550 Pain in unspecified joint: Secondary | ICD-10-CM

## 2019-07-17 ENCOUNTER — Other Ambulatory Visit: Payer: Self-pay | Admitting: Adult Health

## 2019-07-17 DIAGNOSIS — Z1231 Encounter for screening mammogram for malignant neoplasm of breast: Secondary | ICD-10-CM

## 2019-08-06 ENCOUNTER — Other Ambulatory Visit: Payer: Self-pay

## 2019-08-07 ENCOUNTER — Ambulatory Visit
Admission: RE | Admit: 2019-08-07 | Discharge: 2019-08-07 | Disposition: A | Discharge status: Home / Self Care | Payer: 59 | Source: Ambulatory Visit | Attending: Adult Health | Admitting: Adult Health

## 2019-08-07 ENCOUNTER — Encounter: Payer: Self-pay | Admitting: Adult Health

## 2019-08-07 ENCOUNTER — Ambulatory Visit (INDEPENDENT_AMBULATORY_CARE_PROVIDER_SITE_OTHER): Payer: 59 | Admitting: Adult Health

## 2019-08-07 ENCOUNTER — Other Ambulatory Visit: Payer: Self-pay | Admitting: Family Medicine

## 2019-08-07 VITALS — BP 116/74 | Temp 96.7°F | Ht 67.0 in | Wt 227.0 lb

## 2019-08-07 DIAGNOSIS — E039 Hypothyroidism, unspecified: Secondary | ICD-10-CM

## 2019-08-07 DIAGNOSIS — Z23 Encounter for immunization: Secondary | ICD-10-CM

## 2019-08-07 DIAGNOSIS — G43109 Migraine with aura, not intractable, without status migrainosus: Secondary | ICD-10-CM

## 2019-08-07 DIAGNOSIS — Z0000 Encounter for general adult medical examination without abnormal findings: Secondary | ICD-10-CM | POA: Diagnosis not present

## 2019-08-07 DIAGNOSIS — K7581 Nonalcoholic steatohepatitis (NASH): Secondary | ICD-10-CM

## 2019-08-07 DIAGNOSIS — I10 Essential (primary) hypertension: Secondary | ICD-10-CM | POA: Diagnosis not present

## 2019-08-07 DIAGNOSIS — Z1231 Encounter for screening mammogram for malignant neoplasm of breast: Secondary | ICD-10-CM

## 2019-08-07 LAB — LIPID PANEL
Cholesterol: 257 mg/dL — ABNORMAL HIGH (ref 0–200)
HDL: 76.8 mg/dL (ref >39.00)
LDL Cholesterol: 155 mg/dL — ABNORMAL HIGH (ref 0–99)
NonHDL: 180.13
Total CHOL/HDL Ratio: 3
Triglycerides: 124 mg/dL (ref 0.0–149.0)
VLDL: 24.8 mg/dL (ref 0.0–40.0)

## 2019-08-07 LAB — CBC WITH DIFFERENTIAL/PLATELET
Basophils Absolute: 0.1 10*3/uL (ref 0.0–0.1)
Basophils Relative: 1.3 % (ref 0.0–3.0)
Eosinophils Absolute: 0 10*3/uL (ref 0.0–0.7)
Eosinophils Relative: 1.1 % (ref 0.0–5.0)
HCT: 40.4 % (ref 36.0–46.0)
Hemoglobin: 13.6 g/dL (ref 12.0–15.0)
Lymphocytes Relative: 23.8 % (ref 12.0–46.0)
Lymphs Abs: 1 10*3/uL (ref 0.7–4.0)
MCHC: 33.7 g/dL (ref 30.0–36.0)
MCV: 92.1 fl (ref 78.0–100.0)
Monocytes Absolute: 0.3 10*3/uL (ref 0.1–1.0)
Monocytes Relative: 7 % (ref 3.0–12.0)
Neutro Abs: 2.7 10*3/uL (ref 1.4–7.7)
Neutrophils Relative %: 66.8 % (ref 43.0–77.0)
Platelets: 285 10*3/uL (ref 150.0–400.0)
RBC: 4.39 Mil/uL (ref 3.87–5.11)
RDW: 14.5 % (ref 11.5–15.5)
WBC: 4.1 10*3/uL (ref 4.0–10.5)

## 2019-08-07 LAB — COMPREHENSIVE METABOLIC PANEL
ALT: 25 U/L (ref 0–35)
AST: 22 U/L (ref 0–37)
Albumin: 4.5 g/dL (ref 3.5–5.2)
Alkaline Phosphatase: 97 U/L (ref 39–117)
BUN: 19 mg/dL (ref 6–23)
CO2: 29 mEq/L (ref 19–32)
Calcium: 9.6 mg/dL (ref 8.4–10.5)
Chloride: 103 mEq/L (ref 96–112)
Creatinine, Ser: 0.93 mg/dL (ref 0.40–1.20)
GFR: 61.35 mL/min (ref >60.00)
Glucose, Bld: 100 mg/dL — ABNORMAL HIGH (ref 70–99)
Potassium: 4 mEq/L (ref 3.5–5.1)
Sodium: 139 mEq/L (ref 135–145)
Total Bilirubin: 0.6 mg/dL (ref 0.2–1.2)
Total Protein: 7 g/dL (ref 6.0–8.3)

## 2019-08-07 LAB — TSH: TSH: 3.88 u[IU]/mL (ref 0.35–4.50)

## 2019-08-07 MED ORDER — RIZATRIPTAN BENZOATE 10 MG PO TBDP: 10.0000 mg | ORAL_TABLET | ORAL | 2 refills | Status: DC | PRN

## 2019-08-07 MED ORDER — ROSUVASTATIN CALCIUM 10 MG PO TABS
10.0000 mg | ORAL_TABLET | Freq: Every day | ORAL | 3 refills | Status: DC
Start: 2019-08-07 — End: 2020-08-26

## 2019-08-07 MED ORDER — TRIAMTERENE-HCTZ 37.5-25 MG PO TABS: 1.0000 | ORAL_TABLET | Freq: Every day | ORAL | 3 refills | Status: DC

## 2019-08-07 NOTE — Addendum Note (Signed)
Addended by: Miles Costain T on: 08/07/2019 09:23 AM   Modules accepted: Orders

## 2019-08-07 NOTE — Patient Instructions (Signed)
It was great seeing you today   We will follow up with you regarding your blood work   Continue to stay active and eat healthy   Follow up in one year or sooner if needed

## 2019-08-07 NOTE — Progress Notes (Signed)
Subjective:    Patient ID: Laura Caldwell, female    DOB: 1960/11/09, 59 y.o.   MRN: [id number]  HPI Patient presents for yearly preventative medicine examination. She is a pleasant 59 year old female who  has a past medical history of ASTHMA (04/26/2009), AUTOIMMUNE DISEASE NOT ELSEWHERE  CLASSIFIED (04/19/2009), ELEVATED BP READING WITHOUT DX HYPERTENSION (12/26/2007), NASH (nonalcoholic steatohepatitis), Nonalcoholic fatty liver disease (08/12/2014), PREMATURE VENTRICULAR CONTRACTIONS (07/09/2009), and Thyroid disease.  Essential hypertension-currently prescribed Maxide 25.  She denies dizziness, lightheadedness, chest pain, or shortness of breath.  Hypothyroidism - currently controlled on Synthroid 50 mcg daily.  Migraine headaches-infrequent, uses Maxalt as needed.  Arthritis - She is currently being worked up by Rheumatology for RA. Rheumatology believes that it is likely osteoarthritis rather than RA, but awaiting lab results.   All immunizations and health maintenance protocols were reviewed with the patient and needed orders were placed. She is due for tdap, shingrex, and covid. Refuses Covid vaccination at this time. She will due tdap today.   Appropriate screening laboratory values were ordered for the patient including screening of hyperlipidemia, renal function and hepatic function.   Medication reconciliation,  past medical history, social history, problem list and allergies were reviewed in detail with the patient  Goals were established with regard to weight loss, exercise, and  diet in compliance with medications. She has joined [employer] and is being more active since she had a knee replacement Wt Readings from Last 3 Encounters:  08/07/19 227 lb (103 kg)  01/30/19 236 lb (107 kg)  05/02/18 216 lb (98 kg)     Review of Systems  Constitutional: Negative.   HENT: Negative.   Eyes: Negative.   Respiratory: Negative.   Cardiovascular: Negative.   Gastrointestinal: Negative.    Endocrine: Negative.   Genitourinary: Negative.   Musculoskeletal: Negative.   Skin: Negative.   Allergic/Immunologic: Negative.   Neurological: Negative.   Hematological: Negative.   Psychiatric/Behavioral: Negative.    Past Medical History:  Diagnosis Date  . ASTHMA 04/26/2009  . AUTOIMMUNE DISEASE NOT ELSEWHERE  CLASSIFIED 04/19/2009  . ELEVATED BP READING WITHOUT DX HYPERTENSION 12/26/2007  . NASH (nonalcoholic steatohepatitis)   . Nonalcoholic fatty liver disease 08/12/2014  . PREMATURE VENTRICULAR CONTRACTIONS 07/09/2009  . Thyroid disease     Social History   Socioeconomic History  . Marital status: Married    Spouse name: Not on file  . Number of children: 2  . Years of education: Not on file  . Highest education level: Not on file  Occupational History  . Occupation: nurse  Tobacco Use  . Smoking status: Never Smoker  . Smokeless tobacco: Never Used  Substance and Sexual Activity  . Alcohol use: Yes    Comment: occasional  . Drug use: No  . Sexual activity: Not on file  Other Topics Concern  . 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   07/01/2014   Social Determinants of Health   Financial Resource Strain:   . Difficulty of Paying Living Expenses:   Food Insecurity:   . Worried About Charity fundraiser in the Last Year:   . [place] in the Last  Year:   Transportation Needs:   . Film/video editor (Medical):   Marland Kitchen Lack of Transportation (Non-Medical):   Physical Activity:   . Days of Exercise per Week:   . Minutes of Exercise per Session:   Stress:   . Feeling of Stress :   Social Connections:   . Frequency of Communication with Friends and Family:   . Frequency of Social Gatherings  with Friends and Family:   . Attends Religious Services:   . Active Member of Clubs or Organizations:   . Attends Archivist Meetings:   Marland Kitchen Marital Status:   Intimate Partner Violence:   . Fear of Current or Ex-Partner:   . Emotionally Abused:   Marland Kitchen Physically Abused:   . Sexually Abused:     Past Surgical History:  Procedure Laterality Date  . ABDOMINAL HYSTERECTOMY    . BUNIONECTOMY WITH HAMMERTOE RECONSTRUCTION  2013  . COLONOSCOPY    . GANGLION CYST EXCISION Left 1995   left wrist  . KNEE ARTHROSCOPY Left   . WRIST RECONSTRUCTION Right 2010    Family History  Problem Relation Age of Onset  . Colon cancer Neg Hx   . Esophageal cancer Neg Hx   . Rectal cancer Neg Hx   . Stomach cancer Neg Hx   . Breast cancer Neg Hx     No Known Allergies  Current Outpatient Medications on File Prior to Visit  Medication Sig Dispense Refill  . levothyroxine (SYNTHROID) 50 MCG tablet TAKE 1 TABLET BY MOUTH EVERY DAY 90 tablet 0  . rizatriptan (MAXALT-MLT) 10 MG disintegrating tablet Take 1 tablet (10 mg total) by mouth as needed for migraine. May repeat in 2 hours if needed 30 tablet 2  . triamterene-hydrochlorothiazide (MAXZIDE-25) 37.5-25 MG tablet Take 1 tablet by mouth daily. 90 tablet 2   No current facility-administered medications on file prior to visit.    BP 116/74   Temp (!) 96.7 F (35.9 C) (Temporal)   Ht 5\' 7"  (1.702 m)   Wt 227 lb (103 kg)   BMI 35.55 kg/m       Objective:   Physical Exam Vitals and nursing note reviewed.  Constitutional:      General: She is not in acute distress.    Appearance: Normal appearance. She is well-developed. She is not ill-appearing.  HENT:     Head: Normocephalic and atraumatic.     Right Ear: Tympanic membrane, ear canal and external ear normal. There is no impacted cerumen.     Left Ear: Tympanic membrane, ear canal and external ear normal. There is no impacted cerumen.     Nose: Nose normal. No congestion or  rhinorrhea.     Mouth/Throat:     Mouth: Mucous membranes are moist.     Pharynx: Oropharynx is clear. No oropharyngeal exudate or posterior oropharyngeal erythema.  Eyes:     General:        Right eye: No discharge.        Left eye: No discharge.     Extraocular Movements: Extraocular movements intact.     Conjunctiva/sclera: Conjunctivae normal.     Pupils: Pupils are equal, round, and reactive to light.  Neck:     Thyroid: No thyromegaly.     Vascular: No carotid bruit.     Trachea: No tracheal deviation.  Cardiovascular:     Rate and Rhythm: Normal rate and regular rhythm.     Pulses: Normal pulses.     Heart sounds: Normal  heart sounds. No murmur heard.  No friction rub. No gallop.   Pulmonary:     Effort: Pulmonary effort is normal. No respiratory distress.     Breath sounds: Normal breath sounds. No stridor. No wheezing, rhonchi or rales.  Chest:     Chest wall: No tenderness.  Abdominal:     General: Abdomen is flat. Bowel sounds are normal. There is no distension.     Palpations: Abdomen is soft. There is no mass.     Tenderness: There is no abdominal tenderness. There is no right CVA tenderness, left CVA tenderness, guarding or rebound.     Hernia: No hernia is present.  Musculoskeletal:        General: No swelling, tenderness, deformity or signs of injury. Normal range of motion.     Cervical back: Normal range of motion and neck supple.     Right lower leg: No edema.     Left lower leg: No edema.  Lymphadenopathy:     Cervical: No cervical adenopathy.  Skin:    General: Skin is warm and dry.     Coloration: Skin is not jaundiced or pale.     Findings: No bruising, erythema, lesion or rash.  Neurological:     General: No focal deficit present.     Mental Status: She is alert and oriented to person, place, and time.     Cranial Nerves: No cranial nerve deficit.     Sensory: No sensory deficit.     Motor: No weakness.     Coordination: Coordination normal.      Gait: Gait normal.     Deep Tendon Reflexes: Reflexes normal.  Psychiatric:        Mood and Affect: Mood normal.        Behavior: Behavior normal.        Thought Content: Thought content normal.        Judgment: Judgment normal.       Assessment & Plan:  1. Routine general medical examination at a health care facility - Follow up in one year - Continue to stay active and eat healthy - CBC with Differential/Platelet - Comprehensive metabolic panel - Lipid panel - TSH  2. Hypothyroidism, unspecified type - Consider increase in synthroid  - CBC with Differential/Platelet - Comprehensive metabolic panel - Lipid panel - TSH  3. Migraine with aura and without status migrainosus, not intractable  - rizatriptan (MAXALT-MLT) 10 MG disintegrating tablet; Take 1 tablet (10 mg total) by mouth as needed for migraine. May repeat in 2 hours if needed  Dispense: 30 tablet; Refill: 2  4. NASH (nonalcoholic steatohepatitis) - Continue to monitor  - CBC with Differential/Platelet - Comprehensive metabolic panel - Lipid panel - TSH  5. Essential hypertension - Well controlled. No change in medications  - CBC with Differential/Platelet - Comprehensive metabolic panel - Lipid panel - TSH - triamterene-hydrochlorothiazide (MAXZIDE-25) 37.5-25 MG tablet; Take 1 tablet by mouth daily.  Dispense: 90 tablet; Refill: 3 - rizatriptan (MAXALT-MLT) 10 MG disintegrating tablet; Take 1 tablet (10 mg total) by mouth as needed for migraine. May repeat in 2 hours if needed  Dispense: 30 tablet; Refill: 2  Dorothyann Peng, NP

## 2019-08-08 ENCOUNTER — Other Ambulatory Visit: Payer: Self-pay | Admitting: Adult Health

## 2019-08-08 DIAGNOSIS — I10 Essential (primary) hypertension: Secondary | ICD-10-CM

## 2019-08-10 NOTE — Telephone Encounter (Signed)
SENT IN FOR 1 YEAR ON 08/07/2019.

## 2019-08-31 ENCOUNTER — Encounter: Payer: Self-pay | Admitting: Adult Health

## 2019-09-01 NOTE — Telephone Encounter (Signed)
Patient called wanting to schedule a virtual  Appointment with [facility] for chronic back pain. She doesn't feel like she needs another appointment with Tommi Rumps since this is a well documented issue. I told the patient that Tommi Rumps didn't have anything available today and I was going to offer her another provider and she said just forget it and hung up.

## 2019-10-19 ENCOUNTER — Other Ambulatory Visit: Payer: Self-pay | Admitting: Adult Health

## 2020-01-18 ENCOUNTER — Other Ambulatory Visit: Payer: Self-pay | Admitting: Adult Health

## 2020-01-26 ENCOUNTER — Encounter: Payer: Self-pay | Admitting: Family Medicine

## 2020-01-26 ENCOUNTER — Other Ambulatory Visit: Payer: Self-pay

## 2020-01-26 ENCOUNTER — Ambulatory Visit (INDEPENDENT_AMBULATORY_CARE_PROVIDER_SITE_OTHER): Payer: 59 | Admitting: Family Medicine

## 2020-01-26 VITALS — BP 124/78 | HR 84 | Temp 98.9°F | Resp 16 | Ht 67.0 in | Wt 243.0 lb

## 2020-01-26 DIAGNOSIS — K219 Gastro-esophageal reflux disease without esophagitis: Secondary | ICD-10-CM

## 2020-01-26 DIAGNOSIS — R1013 Epigastric pain: Secondary | ICD-10-CM | POA: Diagnosis not present

## 2020-01-26 MED ORDER — OMEPRAZOLE 40 MG PO CPDR: 40.0000 mg | DELAYED_RELEASE_CAPSULE | Freq: Every day | ORAL | 1 refills | Status: DC

## 2020-01-26 NOTE — Patient Instructions (Signed)
A few things to remember from today's visit:   Epigastric abdominal pain - Plan: Lipase  Gastroesophageal reflux disease, unspecified whether esophagitis present - Plan: omeprazole (PRILOSEC) 40 MG capsule  Let me know in 2-3 weeks if you are still having pain.  GERD: Avoid foods that make your symptoms worse, for example coffee, chocolate,pepermeint,alcohol, and greasy food. Raising the head of your bed about 6 inches may help with nocturnal symptoms.  Avoid tobacco use. Weight loss (if you are overweight). Avoid lying down for 3 hours after eating.  Instead 3 large meals daily try small and more frequent meals during the day.  You should be evaluated immediately if bloody vomiting, bloody stools, black stools (like tar), difficulty swallowing, food gets stuck on the way down or choking when eating. Abnormal weight loss or severe abdominal pain.  If symptoms are not resolved sometimes endoscopy is necessary.   Please be sure medication list is accurate. If a new problem present, please set up appointment sooner than planned today.

## 2020-01-26 NOTE — Progress Notes (Signed)
ACUTE VISIT Chief Complaint  Patient presents with  . Abdominal Pain   HPI: Laura Caldwell is a 59 y.o. female, who is here today with above complain. This is a new problem.  A couple weeks ago Laura Caldwell had some mild epigastric discomfort and acid reflux + heartburn.  This past weekend pain got worse. Upper abdominal pain, dull/achy, 8/10. Pain was interfering with sleep.  Lying down seems to help, pain better in the morning. Exacerbated by food intake.  Started Omeprazole 20 mg bid.  Omeprazole and dietary changes have helped, pain is now 3/10. + Bloating sensation. Negative for fever,chills,CP,SOB,dysphagia, nausea,vomiting,or melena. Hx of hiatal hernia.  Loose stools for 10 years. Laura Caldwell has had GI work-up. Colonoscopy 03/12/2013.  Hx of diffuse hepatic steatosis. Laura Caldwell drinks about a glass of wine with night meal. Friday Laura Caldwell drank more alcohol than usual,Laura Caldwell went out with friends.  Lab Results  Component Value Date   ALT 25 08/07/2019   AST 22 08/07/2019   ALKPHOS 97 08/07/2019   BILITOT 0.6 08/07/2019   Lab Results  Component Value Date   CREATININE 0.93 08/07/2019   BUN 19 08/07/2019   NA 139 08/07/2019   K 4.0 08/07/2019   CL 103 08/07/2019   CO2 29 08/07/2019    Review of Systems  Constitutional: Positive for fatigue. Negative for activity change.  HENT: Negative for mouth sores and sore throat.   Respiratory: Negative for cough and wheezing.   Gastrointestinal: Negative for blood in stool.       No changes in bowel habits.  Genitourinary: Negative for decreased urine volume, dysuria and hematuria.  Musculoskeletal: Negative for arthralgias, back pain and neck pain.  Hematological: Negative for adenopathy. Does not bruise/bleed easily.  Rest see pertinent positives and negatives per HPI.  Current Outpatient Medications on File Prior to Visit  Medication Sig Dispense Refill  . levothyroxine (SYNTHROID) 50 MCG tablet TAKE 1 TABLET BY MOUTH EVERY  DAY 90 tablet 1  . rizatriptan (MAXALT-MLT) 10 MG disintegrating tablet Take 1 tablet (10 mg total) by mouth as needed for migraine. May repeat in 2 hours if needed 30 tablet 2  . rosuvastatin (CRESTOR) 10 MG tablet Take 1 tablet (10 mg total) by mouth at bedtime. 90 tablet 3  . triamterene-hydrochlorothiazide (MAXZIDE-25) 37.5-25 MG tablet Take 1 tablet by mouth daily. 90 tablet 3   No current facility-administered medications on file prior to visit.   Past Medical History:  Diagnosis Date  . ASTHMA 04/26/2009  . AUTOIMMUNE DISEASE NOT ELSEWHERE  CLASSIFIED 04/19/2009  . ELEVATED BP READING WITHOUT DX HYPERTENSION 12/26/2007  . NASH (nonalcoholic steatohepatitis)   . Nonalcoholic fatty liver disease 08/12/2014  . PREMATURE VENTRICULAR CONTRACTIONS 07/09/2009  . Thyroid disease    No Known Allergies  Social History   Socioeconomic History  . Marital status: Married    Spouse name: Not on file  . Number of children: 2  . Years of education: Not on file  . Highest education level: Not on file  Occupational History  . Occupation: nurse  Tobacco Use  . Smoking status: Never Smoker  . Smokeless tobacco: Never Used  Substance and Sexual Activity  . Alcohol use: Yes    Comment: occasional  . Drug use: No  . Sexual activity: Not on file  Other Topics Concern  . 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   07/01/2014   Social Determinants of Health   Financial Resource Strain:   . Difficulty of Paying Living Expenses: Not on file  Food Insecurity:   . Worried About Charity fundraiser in the Last Year: Not on file  . Ran Out of Food in the Last Year: Not on file  Transportation Needs:   . Lack of Transportation (Medical): Not  on file  . Lack of Transportation (Non-Medical): Not on file  Physical Activity:   . Days of Exercise per Week: Not on file  . Minutes of Exercise per Session: Not on file  Stress:   . Feeling of Stress : Not on file  Social Connections:   . Frequency of Communication with Friends and Family: Not on file  . Frequency of Social Gatherings with Friends and Family: Not on file  . Attends Religious Services: Not on file  . Active Member of Clubs or Organizations: Not on file  . Attends Archivist Meetings: Not on file  . Marital Status: Not on file    Vitals:   01/26/20 1422  BP: 124/78  Pulse: 84  Resp: 16  Temp: 98.9 F (37.2 C)  SpO2: 97%   Body mass index is 38.06 kg/m.  Physical Exam Vitals and nursing note reviewed.  Constitutional:      General: Laura Caldwell is not in acute distress.    Appearance: Laura Caldwell is well-developed. Laura Caldwell is not ill-appearing.  HENT:     Head: Normocephalic and atraumatic.  Eyes:     General: No scleral icterus.    Conjunctiva/sclera: Conjunctivae normal.  Cardiovascular:     Rate and Rhythm: Normal rate and regular rhythm.     Heart sounds: No murmur heard.   Pulmonary:     Effort: Pulmonary effort is normal. No respiratory distress.     Breath sounds: Normal breath sounds.  Abdominal:     General: Bowel sounds are normal.     Palpations: Abdomen is soft. There is no hepatomegaly or mass.     Tenderness: There is no abdominal tenderness.  Lymphadenopathy:     Cervical: No cervical adenopathy.     Upper Body:     Right upper body: No supraclavicular adenopathy.     Left upper body: No supraclavicular adenopathy.  Skin:    General: Skin is warm.     Findings: No erythema or rash.  Neurological:     General: No focal deficit present.     Mental Status: Laura Caldwell is alert and oriented to person, place, and time.  Psychiatric:     Comments: Well groomed, good eye contact.    ASSESSMENT AND PLAN:  Laura Caldwell was seen today for abdominal  pain.  Diagnoses and all orders for this visit: Orders Placed This Encounter  Procedures  . Lipase    Epigastric abdominal pain Improved. Examination today negative. Possible causes discussed. I do not think imaging is needed at this time. Instructed about warning signs.  Gastroesophageal reflux disease, unspecified whether esophagitis present Continue GERD precautions. Omeprazole 40 mg daily with empty stomach. In about 4-6 weeks Laura Caldwell can decrease dose of PPI to 20 mg daily and eventually may be able to continue prn. If not better in 2-3 weeks, we can consider a different PPI.  -  omeprazole (PRILOSEC) 40 MG capsule; Take 1 capsule (40 mg total) by mouth daily.   Return if symptoms worsen or fail to improve.   Laura Anselmo G. Martinique, MD  [facility]. [place] office.  A few things to remember from today's visit:   Epigastric abdominal pain - Plan: Lipase  Gastroesophageal reflux disease, unspecified whether esophagitis present - Plan: omeprazole (PRILOSEC) 40 MG capsule  Let me know in 2-3 weeks if you are still having pain.  GERD: Avoid foods that make your symptoms worse, for example coffee, chocolate,pepermeint,alcohol, and greasy food. Raising the head of your bed about 6 inches may help with nocturnal symptoms.  Avoid tobacco use. Weight loss (if you are overweight). Avoid lying down for 3 hours after eating.  Instead 3 large meals daily try small and more frequent meals during the day.  You should be evaluated immediately if bloody vomiting, bloody stools, black stools (like tar), difficulty swallowing, food gets stuck on the way down or choking when eating. Abnormal weight loss or severe abdominal pain.  If symptoms are not resolved sometimes endoscopy is necessary.   Please be sure medication list is accurate. If a new problem present, please set up appointment sooner than planned today.

## 2020-01-27 LAB — LIPASE: Lipase: 19 U/L (ref 7–60)

## 2020-03-25 ENCOUNTER — Other Ambulatory Visit: Payer: Self-pay | Admitting: Family Medicine

## 2020-03-25 DIAGNOSIS — K219 Gastro-esophageal reflux disease without esophagitis: Secondary | ICD-10-CM

## 2020-03-25 NOTE — Telephone Encounter (Signed)
Prescribed by Dr. Martinique.  Ok to continue?

## 2020-03-25 NOTE — Telephone Encounter (Signed)
If she still needs it, that is fine to send in for 90 +1

## 2020-03-25 NOTE — Telephone Encounter (Signed)
Spoke to the pt.  She no longer needs the omeprazole.  Medication denied.  Nothing further needed.

## 2020-08-06 ENCOUNTER — Other Ambulatory Visit: Payer: Self-pay | Admitting: Adult Health

## 2020-08-08 NOTE — Telephone Encounter (Signed)
Pt declined and stated her refill will prob be a short supply and her Thyroid hasnt changed in years. Will refill Rx.

## 2020-08-08 NOTE — Telephone Encounter (Signed)
Called pt to see if she wanted to wait until her appt. For a refill.

## 2020-08-24 ENCOUNTER — Other Ambulatory Visit: Payer: Self-pay

## 2020-08-25 ENCOUNTER — Encounter: Payer: Self-pay | Admitting: Adult Health

## 2020-08-25 ENCOUNTER — Ambulatory Visit (INDEPENDENT_AMBULATORY_CARE_PROVIDER_SITE_OTHER): Payer: 59 | Admitting: Adult Health

## 2020-08-25 VITALS — BP 120/80 | HR 73 | Temp 98.2°F | Ht 66.5 in | Wt 236.0 lb

## 2020-08-25 DIAGNOSIS — E039 Hypothyroidism, unspecified: Secondary | ICD-10-CM

## 2020-08-25 DIAGNOSIS — Z0000 Encounter for general adult medical examination without abnormal findings: Secondary | ICD-10-CM | POA: Diagnosis not present

## 2020-08-25 DIAGNOSIS — I10 Essential (primary) hypertension: Secondary | ICD-10-CM | POA: Diagnosis not present

## 2020-08-25 DIAGNOSIS — G43109 Migraine with aura, not intractable, without status migrainosus: Secondary | ICD-10-CM | POA: Diagnosis not present

## 2020-08-25 DIAGNOSIS — L299 Pruritus, unspecified: Secondary | ICD-10-CM

## 2020-08-25 DIAGNOSIS — E782 Mixed hyperlipidemia: Secondary | ICD-10-CM

## 2020-08-25 DIAGNOSIS — Z23 Encounter for immunization: Secondary | ICD-10-CM | POA: Diagnosis not present

## 2020-08-25 LAB — CBC WITH DIFFERENTIAL/PLATELET
Basophils Absolute: 0 10*3/uL (ref 0.0–0.1)
Basophils Relative: 0.7 % (ref 0.0–3.0)
Eosinophils Absolute: 0 10*3/uL (ref 0.0–0.7)
Eosinophils Relative: 0.4 % (ref 0.0–5.0)
HCT: 41.4 % (ref 36.0–46.0)
Hemoglobin: 14.2 g/dL (ref 12.0–15.0)
Lymphocytes Relative: 31 % (ref 12.0–46.0)
Lymphs Abs: 2.1 10*3/uL (ref 0.7–4.0)
MCHC: 34.2 g/dL (ref 30.0–36.0)
MCV: 92.4 fl (ref 78.0–100.0)
Monocytes Absolute: 0.4 10*3/uL (ref 0.1–1.0)
Monocytes Relative: 5.9 % (ref 3.0–12.0)
Neutro Abs: 4.1 10*3/uL (ref 1.4–7.7)
Neutrophils Relative %: 62 % (ref 43.0–77.0)
Platelets: 264 10*3/uL (ref 150.0–400.0)
RBC: 4.48 Mil/uL (ref 3.87–5.11)
RDW: 14 % (ref 11.5–15.5)
WBC: 6.7 10*3/uL (ref 4.0–10.5)

## 2020-08-25 LAB — COMPREHENSIVE METABOLIC PANEL
ALT: 30 U/L (ref 0–35)
AST: 23 U/L (ref 0–37)
Albumin: 4.4 g/dL (ref 3.5–5.2)
Alkaline Phosphatase: 90 U/L (ref 39–117)
BUN: 24 mg/dL — ABNORMAL HIGH (ref 6–23)
CO2: 30 mEq/L (ref 19–32)
Calcium: 9.7 mg/dL (ref 8.4–10.5)
Chloride: 101 mEq/L (ref 96–112)
Creatinine, Ser: 1.22 mg/dL — ABNORMAL HIGH (ref 0.40–1.20)
GFR: 47.81 mL/min — ABNORMAL LOW (ref >60.00)
Glucose, Bld: 87 mg/dL (ref 70–99)
Potassium: 3.5 mEq/L (ref 3.5–5.1)
Sodium: 139 mEq/L (ref 135–145)
Total Bilirubin: 0.7 mg/dL (ref 0.2–1.2)
Total Protein: 7 g/dL (ref 6.0–8.3)

## 2020-08-25 LAB — LIPID PANEL
Cholesterol: 252 mg/dL — ABNORMAL HIGH (ref 0–200)
HDL: 86.4 mg/dL (ref >39.00)
LDL Cholesterol: 139 mg/dL — ABNORMAL HIGH (ref 0–99)
NonHDL: 165.44
Total CHOL/HDL Ratio: 3
Triglycerides: 133 mg/dL (ref 0.0–149.0)
VLDL: 26.6 mg/dL (ref 0.0–40.0)

## 2020-08-25 LAB — HEMOGLOBIN A1C: Hgb A1c MFr Bld: 5.7 % (ref 4.6–6.5)

## 2020-08-25 LAB — TSH: TSH: 3.28 u[IU]/mL (ref 0.35–5.50)

## 2020-08-25 NOTE — Progress Notes (Signed)
Subjective:    Patient ID: Laura Caldwell, female    DOB: 06/16/1960, 60 y.o.   MRN: [id number]  HPI Patient presents for yearly preventative medicine examination. She is a pleasant 60 year old female who  has a past medical history of ASTHMA (04/26/2009), AUTOIMMUNE DISEASE NOT ELSEWHERE  CLASSIFIED (04/19/2009), ELEVATED BP READING WITHOUT DX HYPERTENSION (12/26/2007), NASH (nonalcoholic steatohepatitis), Nonalcoholic fatty liver disease (08/12/2014), PREMATURE VENTRICULAR CONTRACTIONS (07/09/2009), and Thyroid disease.  Hypertension -prescribed Maxide 37.5-25 mg daily.  She denies dizziness, lightheadedness, chest pain, or shortness of breath BP Readings from Last 3 Encounters:  08/25/20 120/80  01/26/20 124/78  08/07/19 116/74   Hypothyroidism-is currently controlled with Synthroid 50 mcg daily Lab Results  Component Value Date   TSH 3.88 08/07/2019   Hyperlipidemia - was prescribed Crestor 10 mg but has not been taking this medication. Does report starting it but then stopped it. Did not experience side effects while taking it.   Lab Results  Component Value Date   CHOL 257 (H) 08/07/2019   HDL 76.80 08/07/2019   LDLCALC 155 (H) 08/07/2019   LDLDIRECT 137.0 01/25/2015   TRIG 124.0 08/07/2019   CHOLHDL 3 08/07/2019   History of migraine headaches-her migraine headaches are infrequent uses Maxalt as needed  Fibromyalgia -questionable diagnosis.  She reports that she was being seen by rheumatology for work-up of rheumatoid arthritis, this work-up came back negative but she was diagnosed with fibromyalgia.  Has not seen rheumatology for follow-up.  Does report multiple joint pain, but her biggest complaint is that of chronic pruritus that has been going on for multiple years.  She has been seen by dermatology for this issue and prescribed a steroid cream which did not help.  Itching happens intermittently with no rash but she will feel a burning discomfort with the itchiness.    All  immunizations and health maintenance protocols were reviewed with the patient and needed orders were placed.  Appropriate screening laboratory values were ordered for the patient including screening of hyperlipidemia, renal function and hepatic function.  Medication reconciliation,  past medical history, social history, problem list and allergies were reviewed in detail with the patient  Goals were established with regard to weight loss, exercise, and  diet in compliance with medications  Wt Readings from Last 3 Encounters:  08/25/20 236 lb (107 kg)  01/26/20 243 lb (110.2 kg)  08/07/19 227 lb (103 kg)   She is up to date on routine colon cancer screening, GYN care, and mammograms   Review of Systems  Constitutional: Negative.   HENT: Negative.    Eyes: Negative.   Respiratory: Negative.    Cardiovascular: Negative.   Gastrointestinal: Negative.   Endocrine: Negative.   Genitourinary: Negative.   Musculoskeletal: Negative.   Skin: Negative.        Chronic pruritis   Allergic/Immunologic: Negative.   Neurological: Negative.   Hematological: Negative.   Psychiatric/Behavioral: Negative.    Past Medical History:  Diagnosis Date   ASTHMA 04/26/2009   AUTOIMMUNE DISEASE NOT ELSEWHERE  CLASSIFIED 04/19/2009   ELEVATED BP READING WITHOUT DX HYPERTENSION 12/26/2007   NASH (nonalcoholic steatohepatitis)    Nonalcoholic fatty liver disease 08/12/2014   PREMATURE VENTRICULAR CONTRACTIONS 07/09/2009   Thyroid disease     Social History   Socioeconomic History   Marital status: Married    Spouse name: Not on file   Number of children: 2   Years of education: Not on file   Highest education level: Not on  file  Occupational History   Occupation: nurse  Tobacco Use   Smoking status: Never   Smokeless tobacco: Never  Substance and Sexual Activity   Alcohol use: Yes    Comment: occasional   Drug use: No   Sexual activity: Not on file  Other Topics Concern   Not on file   Social History Narrative   She's an Therapist, sports, 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   07/01/2014   Social Determinants of Health   Financial Resource Strain: Not on file  Food Insecurity: Not on file  Transportation Needs: Not on file  Physical Activity: Not on file  Stress: Not on file  Social Connections: Not on file  Intimate Partner Violence: Not on file    Past Surgical History:  Procedure Laterality Date   ABDOMINAL HYSTERECTOMY     BUNIONECTOMY WITH HAMMERTOE RECONSTRUCTION  2013   COLONOSCOPY     GANGLION CYST EXCISION Left 1995   left wrist   KNEE ARTHROSCOPY Left    WRIST RECONSTRUCTION Right 2010    Family History  Problem Relation Age of Onset   Colon cancer Neg Hx    Esophageal cancer Neg Hx    Rectal cancer Neg Hx    Stomach cancer Neg Hx    Breast cancer Neg Hx     No Known Allergies  Current Outpatient Medications on File Prior to Visit  Medication Sig Dispense Refill   amoxicillin (AMOXIL) 500 MG capsule Take 2,000 mg by mouth daily.     clobetasol ointment (TEMOVATE) 0.05 % Apply topically.     levothyroxine (SYNTHROID) 50 MCG tablet TAKE 1 TABLET BY MOUTH EVERY DAY 90 tablet 1   methylPREDNISolone (MEDROL DOSEPAK) 4 MG TBPK tablet See admin instructions. follow package directions     triamterene-hydrochlorothiazide (MAXZIDE-25) 37.5-25 MG tablet Take 1 tablet by mouth daily. 90 tablet 3   omeprazole (PRILOSEC) 40 MG capsule Take 1 capsule (40 mg total) by mouth daily. (Patient not taking: Reported on 08/25/2020) 30 capsule 1   rizatriptan (MAXALT-MLT) 10 MG disintegrating tablet Take 1 tablet (10 mg total) by mouth as needed for migraine. May repeat in 2 hours if needed (Patient not taking: Reported on 08/25/2020) 30 tablet 2   rosuvastatin (CRESTOR)  10 MG tablet Take 1 tablet (10 mg total) by mouth at bedtime. (Patient not taking: Reported on 08/25/2020) 90 tablet 3   No current facility-administered medications on file prior to visit.    BP 120/80   Pulse 73   Temp 98.2 F (36.8 C) (Oral)   Ht 5' 6.5" (1.689 m)   Wt 236 lb (107 kg)   SpO2 92%   BMI 37.52 kg/m       Objective:   Physical Exam Vitals and nursing note reviewed.  Constitutional:      General: She is not in acute distress.    Appearance: Normal appearance. She is well-developed. She is obese. She is not ill-appearing.  HENT:     Head: Normocephalic and atraumatic.     Right Ear: Tympanic membrane, ear canal and external ear normal. There is no impacted cerumen.     Left Ear: Tympanic membrane, ear canal and external ear normal. There is no impacted cerumen.  Nose: Nose normal. No congestion or rhinorrhea.     Mouth/Throat:     Mouth: Mucous membranes are moist.     Pharynx: Oropharynx is clear. No oropharyngeal exudate or posterior oropharyngeal erythema.  Eyes:     General:        Right eye: No discharge.        Left eye: No discharge.     Extraocular Movements: Extraocular movements intact.     Conjunctiva/sclera: Conjunctivae normal.     Pupils: Pupils are equal, round, and reactive to light.  Neck:     Thyroid: No thyromegaly.     Vascular: No carotid bruit.     Trachea: No tracheal deviation.  Cardiovascular:     Rate and Rhythm: Normal rate and regular rhythm.     Pulses: Normal pulses.     Heart sounds: Normal heart sounds. No murmur heard.   No friction rub. No gallop.  Pulmonary:     Effort: Pulmonary effort is normal. No respiratory distress.     Breath sounds: Normal breath sounds. No stridor. No wheezing, rhonchi or rales.  Chest:     Chest wall: No tenderness.  Abdominal:     General: Abdomen is flat. Bowel sounds are normal. There is no distension.     Palpations: Abdomen is soft. There is no mass.     Tenderness: There is no  abdominal tenderness. There is no right CVA tenderness, left CVA tenderness, guarding or rebound.     Hernia: No hernia is present.  Musculoskeletal:        General: No swelling, tenderness, deformity or signs of injury. Normal range of motion.     Cervical back: Normal range of motion and neck supple.     Right lower leg: No edema.     Left lower leg: No edema.  Lymphadenopathy:     Cervical: No cervical adenopathy.  Skin:    General: Skin is warm and dry.     Coloration: Skin is not jaundiced or pale.     Findings: No bruising, erythema, lesion or rash.  Neurological:     General: No focal deficit present.     Mental Status: She is alert and oriented to person, place, and time.     Cranial Nerves: No cranial nerve deficit.     Sensory: No sensory deficit.     Motor: No weakness.     Coordination: Coordination normal.     Gait: Gait normal.     Deep Tendon Reflexes: Reflexes normal.  Psychiatric:        Mood and Affect: Mood normal.        Behavior: Behavior normal.        Thought Content: Thought content normal.        Judgment: Judgment normal.      Assessment & Plan:  1. Routine general medical examination at a health care facility - Lifestyle modifications to help lose weight  - Follow up in one year or sooner if needed - CBC with Differential/Platelet; Future - Comprehensive metabolic panel; Future - Lipid panel; Future - TSH; Future - Hemoglobin A1c; Future - Hemoglobin A1c - TSH - Lipid panel - Comprehensive metabolic panel - CBC with Differential/Platelet  2. Hypothyroidism, unspecified type - Consider dose change of synthroid  - CBC with Differential/Platelet; Future - Comprehensive metabolic panel; Future - Lipid panel; Future - TSH; Future - Hemoglobin A1c; Future - Hemoglobin A1c - TSH - Lipid panel - Comprehensive metabolic panel - CBC with  Differential/Platelet  3. Migraine with aura and without status migrainosus, not intractable - Continue  with Maxalt PRN   4. Essential hypertension - Controlled. No change  - CBC with Differential/Platelet; Future - Comprehensive metabolic panel; Future - Lipid panel; Future - TSH; Future - Hemoglobin A1c; Future - Hemoglobin A1c - TSH - Lipid panel - Comprehensive metabolic panel - CBC with Differential/Platelet  5. Mixed hyperlipidemia - Likely going to need statin  - CBC with Differential/Platelet; Future - Comprehensive metabolic panel; Future - Lipid panel; Future - TSH; Future - Hemoglobin A1c; Future - Hemoglobin A1c - TSH - Lipid panel - Comprehensive metabolic panel - CBC with Differential/Platelet  6. Need for shingles vaccine  - Varicella-zoster vaccine IM  7. Chronic pruritus - unknown cause. ? Fibromyalgia. Discussed starting low dose gabapentin or SSRI. She will think about Gabapentin  - CBC with Differential/Platelet; Future - Comprehensive metabolic panel; Future - Lipid panel; Future - TSH; Future - Hemoglobin A1c; Future  Dorothyann Peng, NP

## 2020-08-25 NOTE — Patient Instructions (Signed)
It was great seeing you today   We will follow up with you regarding your blood work   I will see you back in one year for your physical   Please follow up in 2 months for your net shingles vaccination

## 2020-08-26 ENCOUNTER — Telehealth: Payer: Self-pay | Admitting: Adult Health

## 2020-08-26 DIAGNOSIS — I10 Essential (primary) hypertension: Secondary | ICD-10-CM

## 2020-08-26 MED ORDER — GABAPENTIN 100 MG PO CAPS
100.0000 mg | ORAL_CAPSULE | Freq: Three times a day (TID) | ORAL | 0 refills | Status: DC
Start: 2020-08-26 — End: 2021-09-29

## 2020-08-26 MED ORDER — TRIAMTERENE-HCTZ 37.5-25 MG PO TABS: 1.0000 | ORAL_TABLET | Freq: Every day | ORAL | 3 refills | Status: DC

## 2020-08-26 MED ORDER — LEVOTHYROXINE SODIUM 50 MCG PO TABS: 50.0000 ug | ORAL_TABLET | Freq: Every day | ORAL | 3 refills | Status: DC

## 2020-08-26 MED ORDER — ROSUVASTATIN CALCIUM 10 MG PO TABS: 10.0000 mg | ORAL_TABLET | Freq: Every day | ORAL | 3 refills | Status: DC

## 2020-08-26 NOTE — Telephone Encounter (Signed)
error 

## 2020-10-03 ENCOUNTER — Other Ambulatory Visit: Payer: Self-pay | Admitting: Adult Health

## 2020-10-03 DIAGNOSIS — I10 Essential (primary) hypertension: Secondary | ICD-10-CM

## 2020-10-03 DIAGNOSIS — G43109 Migraine with aura, not intractable, without status migrainosus: Secondary | ICD-10-CM

## 2020-10-26 IMAGING — MG DIGITAL SCREENING BILAT W/ TOMO W/ CAD
8 series · 9 of 24 positions shown · non-contrast
Comparison: Previous exam(s).

CLINICAL DATA: Screening.

EXAM:
DIGITAL SCREENING BILATERAL MAMMOGRAM WITH TOMO AND CAD

[R MLO synth-2D]
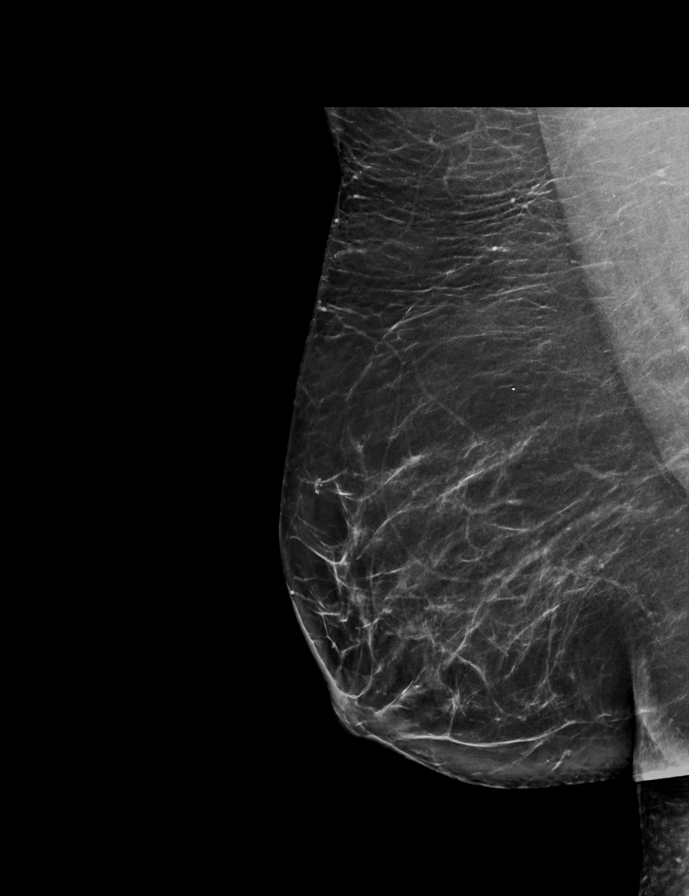

[R CC synth-2D]
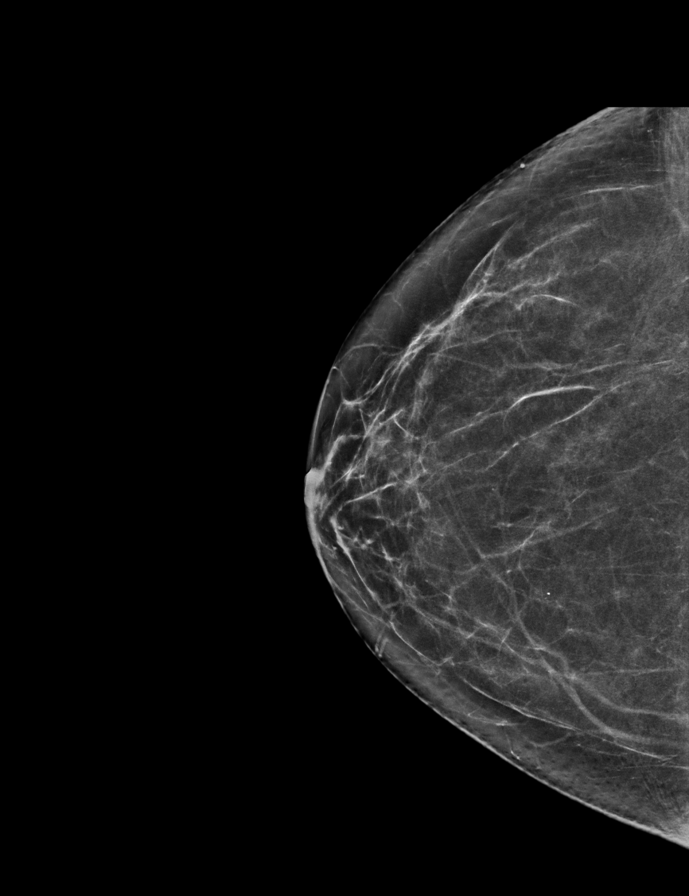

[L MLO synth-2D]
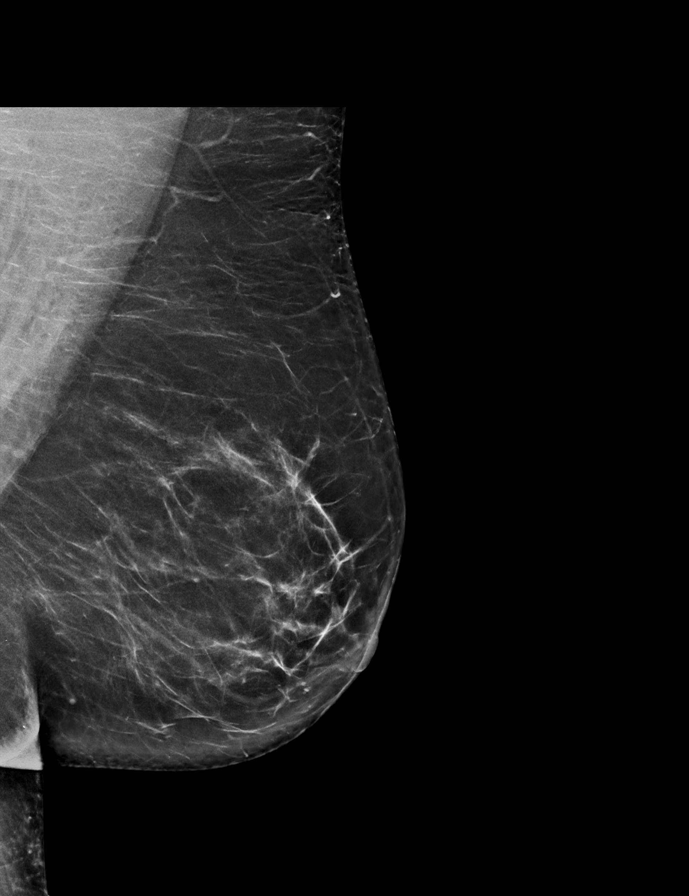

[L CC synth-2D]
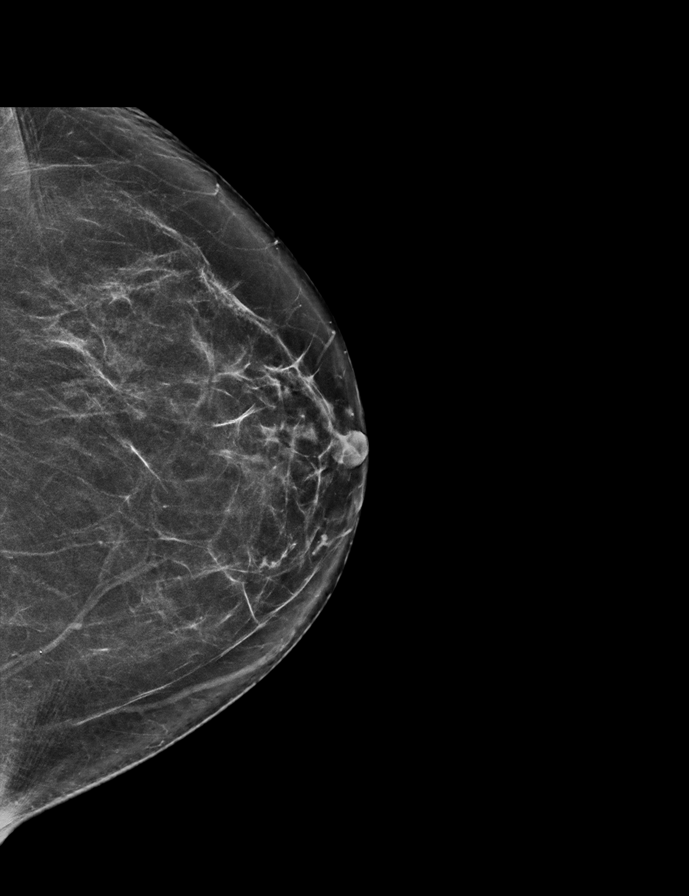

[L CC tomo · 2 of 67 frames shown]
[frame 22/67]
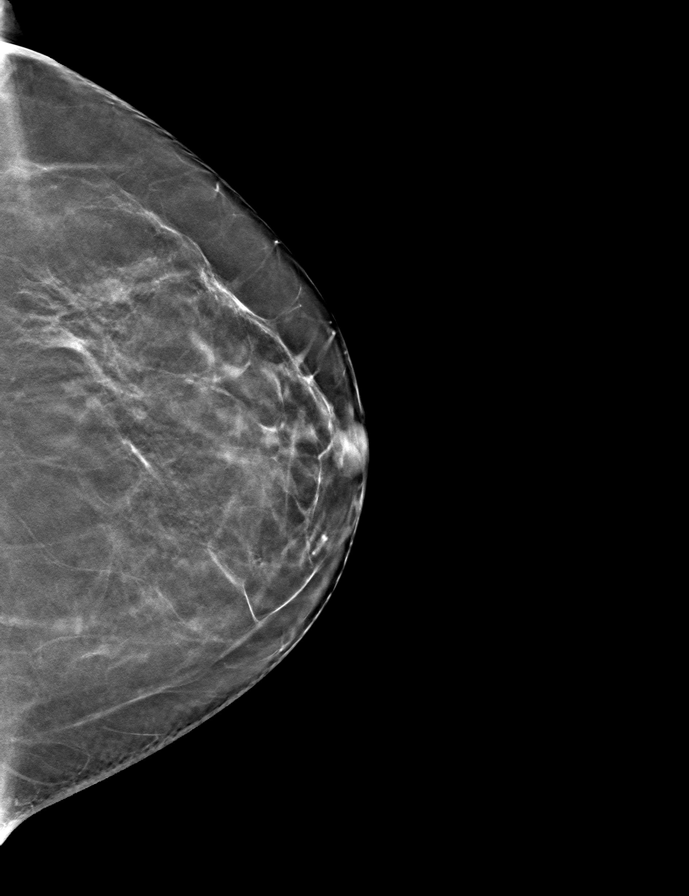
[frame 34/67]
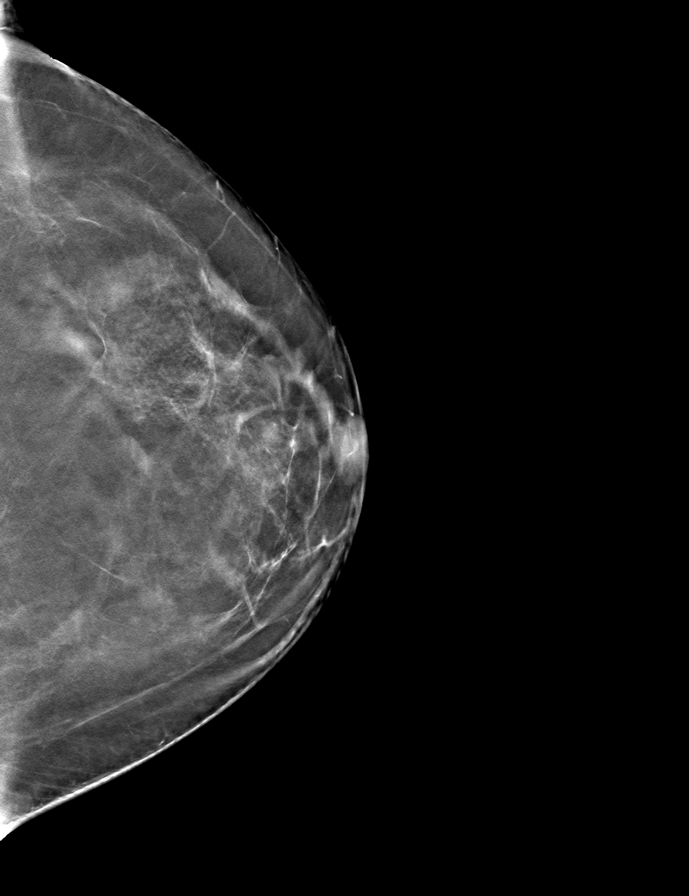

[R MLO tomo · tomo slice 37/74.0]
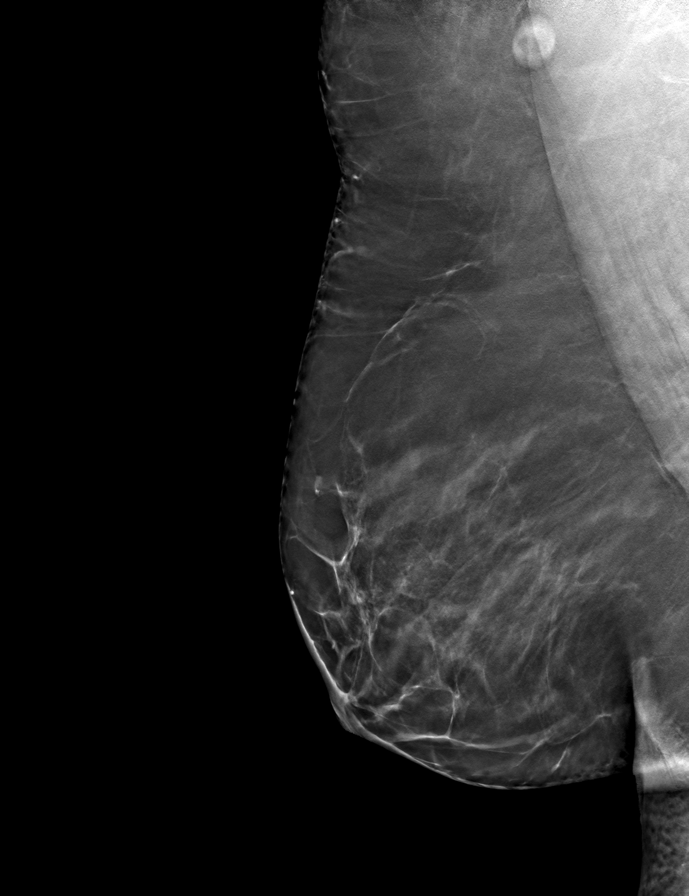

[R CC tomo · tomo slice 33/65.0]
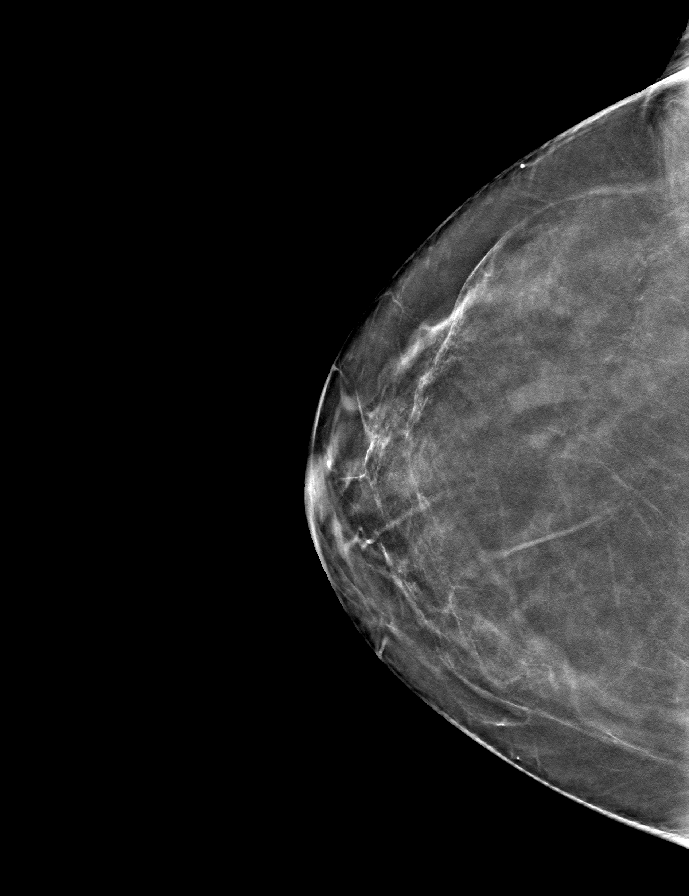

[L MLO tomo · tomo slice 39/76.0]
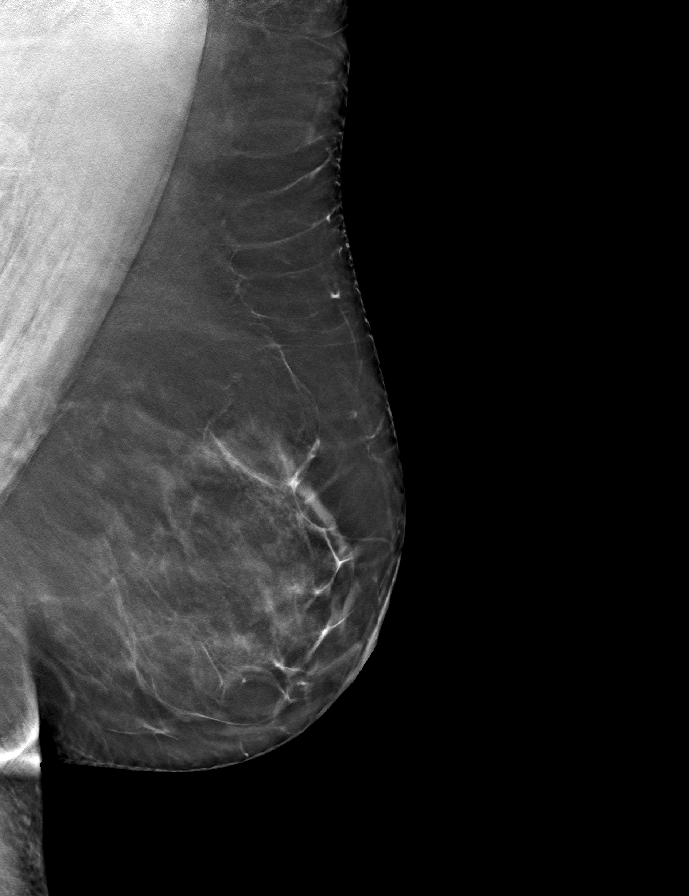

[9 of 24 positions shown; findings below may reference images not displayed]

ACR Breast Density Category b: There are scattered areas of
fibroglandular density.
FINDINGS: There are no findings suspicious for malignancy. Images were
processed with CAD.
IMPRESSION: No mammographic evidence of malignancy. A result letter of this
screening mammogram will be mailed directly to the patient.

RECOMMENDATION:
Screening mammogram in one year. (Code:[id number])

BI-RADS CATEGORY  1: Negative.

## 2020-12-05 ENCOUNTER — Encounter: Payer: Self-pay | Admitting: Adult Health

## 2020-12-06 NOTE — Telephone Encounter (Signed)
Please advise 

## 2020-12-07 ENCOUNTER — Other Ambulatory Visit: Payer: Self-pay | Admitting: Adult Health

## 2020-12-07 DIAGNOSIS — L299 Pruritus, unspecified: Secondary | ICD-10-CM

## 2020-12-28 ENCOUNTER — Other Ambulatory Visit: Payer: Self-pay

## 2021-01-13 ENCOUNTER — Telehealth: Payer: 59 | Admitting: Nurse Practitioner

## 2021-01-13 DIAGNOSIS — R051 Acute cough: Secondary | ICD-10-CM

## 2021-01-13 MED ORDER — BENZONATATE 100 MG PO CAPS: 100.0000 mg | ORAL_CAPSULE | Freq: Three times a day (TID) | ORAL | 0 refills | Status: DC | PRN

## 2021-01-13 MED ORDER — AZITHROMYCIN 250 MG PO TABS
ORAL_TABLET | ORAL | 0 refills | Status: DC
Start: 2021-01-13 — End: 2021-09-29

## 2021-01-13 MED ORDER — PREDNISONE 10 MG (21) PO TBPK
ORAL_TABLET | ORAL | 0 refills | Status: DC
Start: 2021-01-13 — End: 2021-09-29

## 2021-01-13 NOTE — Progress Notes (Signed)
We are sorry that you are not feeling well.  Here is how we plan to help!  Based on your presentation I believe you most likely have A cough due to bacteria.  When patients have a fever and a productive cough with a change in color or increased sputum production, we are concerned about bacterial bronchitis.  If left untreated it can progress to pneumonia.  If your symptoms do not improve with your treatment plan it is important that you contact your provider.   I have prescribed Azithromyin 250 mg: two tablets now and then one tablet daily for 4 additonal days    In addition you may use A prescription cough medication called Tessalon Perles 100mg. You may take 1-2 capsules every 8 hours as needed for your cough.  Prednisone 10 mg daily for 6 days (see taper instructions below)  Directions for 6 day taper: Day 1: 2 tablets before breakfast, 1 after both lunch & dinner and 2 at bedtime Day 2: 1 tab before breakfast, 1 after both lunch & dinner and 2 at bedtime Day 3: 1 tab at each meal & 1 at bedtime Day 4: 1 tab at breakfast, 1 at lunch, 1 at bedtime Day 5: 1 tab at breakfast & 1 tab at bedtime Day 6: 1 tab at breakfast  From your responses in the eVisit questionnaire you describe inflammation in the upper respiratory tract which is causing a significant cough.  This is commonly called Bronchitis and has four common causes:   Allergies Viral Infections Acid Reflux Bacterial Infection Allergies, viruses and acid reflux are treated by controlling symptoms or eliminating the cause. An example might be a cough caused by taking certain blood pressure medications. You stop the cough by changing the medication. Another example might be a cough caused by acid reflux. Controlling the reflux helps control the cough.  USE OF BRONCHODILATOR ("RESCUE") INHALERS: There is a risk from using your bronchodilator too frequently.  The risk is that over-reliance on a medication which only relaxes the muscles  surrounding the breathing tubes can reduce the effectiveness of medications prescribed to reduce swelling and congestion of the tubes themselves.  Although you feel brief relief from the bronchodilator inhaler, your asthma may actually be worsening with the tubes becoming more swollen and filled with mucus.  This can delay other crucial treatments, such as oral steroid medications. If you need to use a bronchodilator inhaler daily, several times per day, you should discuss this with your provider.  There are probably better treatments that could be used to keep your asthma under control.     HOME CARE Only take medications as instructed by your medical team. Complete the entire course of an antibiotic. Drink plenty of fluids and get plenty of rest. Avoid close contacts especially the very young and the elderly Cover your mouth if you cough or cough into your sleeve. Always remember to wash your hands A steam or ultrasonic humidifier can help congestion.   GET HELP RIGHT AWAY IF: You develop worsening fever. You become short of breath You cough up blood. Your symptoms persist after you have completed your treatment plan MAKE SURE YOU  Understand these instructions. Will watch your condition. Will get help right away if you are not doing well or get worse.    Thank you for choosing an e-visit.  Your e-visit answers were reviewed by a board certified advanced clinical practitioner to complete your personal care plan. Depending upon the condition, your plan could   have included both over the counter or prescription medications.  Please review your pharmacy choice. Make sure the pharmacy is open so you can pick up prescription now. If there is a problem, you may contact your provider through [employer] and have the prescription routed to another pharmacy.  Your safety is important to [place]. If you have drug allergies check your prescription carefully.   For the next 24 hours you can use  MyChart to ask questions about today's visit, request a non-urgent call back, or ask for a work or school excuse. You will get an email in the next two days asking about your experience. I hope that your e-visit has been valuable and will speed your recovery.  5-10 minutes spent reviewing and documenting in chart.

## 2021-02-10 ENCOUNTER — Ambulatory Visit: Payer: Self-pay | Admitting: Neurology

## 2021-09-28 ENCOUNTER — Other Ambulatory Visit: Payer: Self-pay | Admitting: Adult Health

## 2021-09-28 DIAGNOSIS — I10 Essential (primary) hypertension: Secondary | ICD-10-CM

## 2021-09-29 ENCOUNTER — Encounter: Payer: Self-pay | Admitting: Adult Health

## 2021-09-29 ENCOUNTER — Ambulatory Visit (INDEPENDENT_AMBULATORY_CARE_PROVIDER_SITE_OTHER): Payer: 59 | Admitting: Adult Health

## 2021-09-29 VITALS — BP 120/78 | HR 64 | Temp 98.4°F | Ht 66.5 in | Wt 218.0 lb

## 2021-09-29 DIAGNOSIS — E039 Hypothyroidism, unspecified: Secondary | ICD-10-CM | POA: Diagnosis not present

## 2021-09-29 DIAGNOSIS — G43109 Migraine with aura, not intractable, without status migrainosus: Secondary | ICD-10-CM

## 2021-09-29 DIAGNOSIS — Z23 Encounter for immunization: Secondary | ICD-10-CM

## 2021-09-29 DIAGNOSIS — R1011 Right upper quadrant pain: Secondary | ICD-10-CM

## 2021-09-29 DIAGNOSIS — I10 Essential (primary) hypertension: Secondary | ICD-10-CM | POA: Diagnosis not present

## 2021-09-29 DIAGNOSIS — Z0000 Encounter for general adult medical examination without abnormal findings: Secondary | ICD-10-CM

## 2021-09-29 DIAGNOSIS — Z0001 Encounter for general adult medical examination with abnormal findings: Secondary | ICD-10-CM

## 2021-09-29 DIAGNOSIS — Z114 Encounter for screening for human immunodeficiency virus [HIV]: Secondary | ICD-10-CM

## 2021-09-29 DIAGNOSIS — E782 Mixed hyperlipidemia: Secondary | ICD-10-CM

## 2021-09-29 NOTE — Progress Notes (Signed)
Subjective:    Patient ID: Laura Caldwell, female    DOB: February 23, 1961, 61 y.o.   MRN: [id number]  HPI Patient presents for yearly preventative medicine examination. She is a pleasant 61 year old female who  has a past medical history of ASTHMA (04/26/2009), AUTOIMMUNE DISEASE NOT ELSEWHERE  CLASSIFIED (04/19/2009), ELEVATED BP READING WITHOUT DX HYPERTENSION (12/26/2007), NASH (nonalcoholic steatohepatitis), Nonalcoholic fatty liver disease (08/12/2014), PREMATURE VENTRICULAR CONTRACTIONS (07/09/2009), and Thyroid disease.  Hypertension-managed with Maxide 37.5-25 mg daily.  She denies dizziness, lightheadedness, chest pain, or shortness of breath BP Readings from Last 3 Encounters:  09/29/21 120/78  08/25/20 120/80  01/26/20 124/78   Hypothyroidism-is currently controlled with Synthroid 50 mcg daily  Hyperlipidemia-prescribed Crestor 10 mg daily, in the past there have been issues with compliance. She tried for a month and it caused myalgias so she stopped the medication.  Lab Results  Component Value Date   CHOL 252 (H) 08/25/2020   HDL 86.40 08/25/2020   LDLCALC 139 (H) 08/25/2020   LDLDIRECT 137.0 01/25/2015   TRIG 133.0 08/25/2020   CHOLHDL 3 08/25/2020   Migraine headache-migraines are infrequent.  She will take Maxalt as needed  Fibromyalgia-questionable diagnosis.  She has been seen by rheumatology in the past where she reports being diagnosed with fibromyalgia.  She does report multiple joint pain.  She was last seen a year ago for her CPE her biggest complaint was that of chronic pruritus that has been going on for multiple years.  She has been seen by dermatology for this issue and prescribed a steroid cream that did not help.  Itching was happening intermittently with no rash but would also feel burning discomfort with the itchiness.  She was placed on a low-dose gabapentin 100 mg TID but did not find this helpful.   Abdominal pain - she reports for quite a while she has had  intermittent right upper quadrant abdominal pain. Recently this discomfort has been more frequent. Will radiate into her right upper chest and between her shoulder blades. Has not noticed any increased pain with fatty foods. Does have intermittent episodes of pale stools and her stool is always soft.    All immunizations and health maintenance protocols were reviewed with the patient and needed orders were placed.  Appropriate screening laboratory values were ordered for the patient including screening of hyperlipidemia, renal function and hepatic function.  Medication reconciliation,  past medical history, social history, problem list and allergies were reviewed in detail with the patient  Goals were established with regard to weight loss, exercise, and  diet in compliance with medications. She is eating healthy  Wt Readings from Last 3 Encounters:  09/29/21 218 lb (98.9 kg)  08/25/20 236 lb (107 kg)  01/26/20 243 lb (110.2 kg)   She is up-to-date on routine GYN care as well as routine colon cancer screening.  Review of Systems  Constitutional: Negative.   HENT: Negative.    Eyes: Negative.   Respiratory: Negative.    Cardiovascular: Negative.   Gastrointestinal:  Positive for abdominal pain. Negative for abdominal distention, constipation, diarrhea, nausea and vomiting.  Endocrine: Negative.   Genitourinary: Negative.   Musculoskeletal:  Positive for arthralgias and back pain.  Skin: Negative.   Allergic/Immunologic: Negative.   Neurological: Negative.   Hematological: Negative.   Psychiatric/Behavioral: Negative.     Past Medical History:  Diagnosis Date   ASTHMA 04/26/2009   AUTOIMMUNE DISEASE NOT ELSEWHERE  CLASSIFIED 04/19/2009   ELEVATED BP READING WITHOUT DX HYPERTENSION  12/26/2007   NASH (nonalcoholic steatohepatitis)    Nonalcoholic fatty liver disease 08/12/2014   PREMATURE VENTRICULAR CONTRACTIONS 07/09/2009   Thyroid disease     Social History   Socioeconomic  History   Marital status: Married    Spouse name: Not on file   Number of children: 2   Years of education: Not on file   Highest education level: Not on file  Occupational History   Occupation: nurse  Tobacco Use   Smoking status: Never   Smokeless tobacco: Never  Substance and Sexual Activity   Alcohol use: Yes    Comment: occasional   Drug use: No   Sexual activity: Not on file  Other Topics Concern   Not on file  Social History Narrative   She's an Therapist, sports, 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   07/01/2014   Social Determinants of Health   Financial Resource Strain: Not on file  Food Insecurity: Not on file  Transportation Needs: Not on file  Physical Activity: Not on file  Stress: Not on file  Social Connections: Not on file  Intimate Partner Violence: Not on file    Past Surgical History:  Procedure Laterality Date   ABDOMINAL HYSTERECTOMY     BUNIONECTOMY WITH HAMMERTOE RECONSTRUCTION  2013   COLONOSCOPY     GANGLION CYST EXCISION Left 1995   left wrist   KNEE ARTHROSCOPY Left    WRIST RECONSTRUCTION Right 2010    Family History  Problem Relation Age of Onset   Colon cancer Neg Hx    Esophageal cancer Neg Hx    Rectal cancer Neg Hx    Stomach cancer Neg Hx    Breast cancer Neg Hx     No Known Allergies  Current Outpatient Medications on File Prior to Visit  Medication Sig Dispense Refill   clobetasol ointment (TEMOVATE) 0.05 % Apply topically as needed.     levothyroxine (SYNTHROID) 50 MCG tablet TAKE 1 TABLET(50 MCG) BY MOUTH DAILY 90 tablet 3   rizatriptan (MAXALT-MLT) 10 MG disintegrating tablet DISSOLVE 1 TABLET BY MOUTH AS NEEDED FOR MIGRAINE. MAY REPEAT IN 2 HOURS IF NEEDED 30 tablet 2   rosuvastatin (CRESTOR) 10 MG tablet Take 1 tablet (10  mg total) by mouth at bedtime. 90 tablet 3   triamterene-hydrochlorothiazide (MAXZIDE-25) 37.5-25 MG tablet TAKE 1 TABLET BY MOUTH DAILY 90 tablet 3   gabapentin (NEURONTIN) 100 MG capsule Take 1 capsule (100 mg total) by mouth 3 (three) times daily. (Patient not taking: Reported on 09/29/2021) 90 capsule 0   No current facility-administered medications on file prior to visit.    BP 120/78   Pulse 64   Temp 98.4 F (36.9 C) (Oral)   Ht 5' 6.5" (1.689 m)   Wt 218 lb (98.9 kg)   SpO2 100%   BMI 34.66 kg/m       Objective:   Physical Exam Vitals and nursing note reviewed.  Constitutional:      General: She is not in acute distress.    Appearance: Normal appearance. She is well-developed. She is obese. She is not ill-appearing.  HENT:     Head: Normocephalic and atraumatic.     Right Ear: Tympanic membrane, ear canal and external ear normal. There is  no impacted cerumen.     Left Ear: Tympanic membrane, ear canal and external ear normal. There is no impacted cerumen.     Nose: Nose normal. No congestion or rhinorrhea.     Mouth/Throat:     Mouth: Mucous membranes are moist.     Pharynx: Oropharynx is clear. No oropharyngeal exudate or posterior oropharyngeal erythema.  Eyes:     General:        Right eye: No discharge.        Left eye: No discharge.     Extraocular Movements: Extraocular movements intact.     Conjunctiva/sclera: Conjunctivae normal.     Pupils: Pupils are equal, round, and reactive to light.  Neck:     Thyroid: No thyromegaly.     Vascular: No carotid bruit.     Trachea: No tracheal deviation.  Cardiovascular:     Rate and Rhythm: Normal rate and regular rhythm.     Pulses: Normal pulses.     Heart sounds: Normal heart sounds. No murmur heard.    No friction rub. No gallop.  Pulmonary:     Effort: Pulmonary effort is normal. No respiratory distress.     Breath sounds: Normal breath sounds. No stridor. No wheezing, rhonchi or rales.  Chest:     Chest  wall: No tenderness.  Abdominal:     General: Abdomen is flat. Bowel sounds are normal. There is no distension.     Palpations: Abdomen is soft. There is no mass.     Tenderness: There is no abdominal tenderness. There is no right CVA tenderness, left CVA tenderness, guarding or rebound.     Hernia: No hernia is present.  Musculoskeletal:        General: No swelling, tenderness, deformity or signs of injury. Normal range of motion.     Cervical back: Normal range of motion and neck supple.     Right lower leg: No edema.     Left lower leg: No edema.  Lymphadenopathy:     Cervical: No cervical adenopathy.  Skin:    General: Skin is warm and dry.     Coloration: Skin is not jaundiced or pale.     Findings: No bruising, erythema, lesion or rash.  Neurological:     General: No focal deficit present.     Mental Status: She is alert and oriented to person, place, and time.     Cranial Nerves: No cranial nerve deficit.     Sensory: No sensory deficit.     Motor: No weakness.     Coordination: Coordination normal.     Gait: Gait normal.     Deep Tendon Reflexes: Reflexes normal.  Psychiatric:        Mood and Affect: Mood normal.        Behavior: Behavior normal.        Thought Content: Thought content normal.        Judgment: Judgment normal.       Assessment & Plan:  1. Routine general medical examination at a health care facility - Continue with weight loss measures. Follow up in one year or sooner if needed - CBC with Differential/Platelet; Future - Comprehensive metabolic panel; Future - Hemoglobin A1c; Future - Lipid panel; Future - TSH; Future  2. Essential hypertension - Well controlled.  - No change in medication  - CBC with Differential/Platelet; Future - Comprehensive metabolic panel; Future - Hemoglobin A1c; Future - Lipid panel; Future - TSH; Future  3. Hypothyroidism, unspecified  type - Consider increase in synthroid  - CBC with Differential/Platelet;  Future - Comprehensive metabolic panel; Future - Hemoglobin A1c; Future - Lipid panel; Future - TSH; Future  4. Migraine with aura and without status migrainosus, not intractable - Continue with imitrex prn   5. Mixed hyperlipidemia - Consider another statin vs lipid clinic  - CBC with Differential/Platelet; Future - Comprehensive metabolic panel; Future - Hemoglobin A1c; Future - Lipid panel; Future - TSH; Future   6. Encounter for screening for HIV  - HIV Antibody (routine testing w rflx); Future  7. Need for shingles vaccine  - Varicella-zoster vaccine IM  8. RUQ abdominal pain - No pain today   R/o gallbladder disease - US Abdomen Limited RUQ (LIVER/GB); Future  Dorothyann Peng, NP

## 2021-09-29 NOTE — Patient Instructions (Signed)
It was great seeing you today   We will follow up with you regarding your lab work   Please let me know if you need anything   Schedule a lab appointment   Someone will call yo to schedule your ultrasound

## 2021-10-12 ENCOUNTER — Ambulatory Visit
Admission: RE | Admit: 2021-10-12 | Discharge: 2021-10-12 | Disposition: A | Discharge status: Home / Self Care | Payer: 59 | Source: Ambulatory Visit | Attending: Adult Health | Admitting: Adult Health

## 2021-10-12 DIAGNOSIS — R1011 Right upper quadrant pain: Secondary | ICD-10-CM

## 2021-10-13 ENCOUNTER — Other Ambulatory Visit: Payer: Self-pay | Admitting: Adult Health

## 2021-10-13 ENCOUNTER — Other Ambulatory Visit (INDEPENDENT_AMBULATORY_CARE_PROVIDER_SITE_OTHER): Payer: 59

## 2021-10-13 ENCOUNTER — Telehealth: Payer: Self-pay | Admitting: Adult Health

## 2021-10-13 DIAGNOSIS — I10 Essential (primary) hypertension: Secondary | ICD-10-CM | POA: Diagnosis not present

## 2021-10-13 DIAGNOSIS — R1011 Right upper quadrant pain: Secondary | ICD-10-CM | POA: Diagnosis not present

## 2021-10-13 DIAGNOSIS — E039 Hypothyroidism, unspecified: Secondary | ICD-10-CM

## 2021-10-13 DIAGNOSIS — E782 Mixed hyperlipidemia: Secondary | ICD-10-CM

## 2021-10-13 DIAGNOSIS — Z114 Encounter for screening for human immunodeficiency virus [HIV]: Secondary | ICD-10-CM

## 2021-10-13 DIAGNOSIS — Z0000 Encounter for general adult medical examination without abnormal findings: Secondary | ICD-10-CM

## 2021-10-13 DIAGNOSIS — G43109 Migraine with aura, not intractable, without status migrainosus: Secondary | ICD-10-CM

## 2021-10-13 LAB — CBC WITH DIFFERENTIAL/PLATELET
Basophils Absolute: 0 10*3/uL (ref 0.0–0.1)
Basophils Relative: 1 % (ref 0.0–3.0)
Eosinophils Absolute: 0 10*3/uL (ref 0.0–0.7)
Eosinophils Relative: 1 % (ref 0.0–5.0)
HCT: 42.1 % (ref 36.0–46.0)
Hemoglobin: 14.3 g/dL (ref 12.0–15.0)
Lymphocytes Relative: 22.5 % (ref 12.0–46.0)
Lymphs Abs: 0.9 10*3/uL (ref 0.7–4.0)
MCHC: 33.9 g/dL (ref 30.0–36.0)
MCV: 91.8 fl (ref 78.0–100.0)
Monocytes Absolute: 0.3 10*3/uL (ref 0.1–1.0)
Monocytes Relative: 7 % (ref 3.0–12.0)
Neutro Abs: 2.8 10*3/uL (ref 1.4–7.7)
Neutrophils Relative %: 68.5 % (ref 43.0–77.0)
Platelets: 278 10*3/uL (ref 150.0–400.0)
RBC: 4.59 Mil/uL (ref 3.87–5.11)
RDW: 14 % (ref 11.5–15.5)
WBC: 4.1 10*3/uL (ref 4.0–10.5)

## 2021-10-13 LAB — LIPID PANEL
Cholesterol: 264 mg/dL — ABNORMAL HIGH (ref 0–200)
HDL: 82.7 mg/dL (ref >39.00)
LDL Cholesterol: 154 mg/dL — ABNORMAL HIGH (ref 0–99)
NonHDL: 181.73
Total CHOL/HDL Ratio: 3
Triglycerides: 140 mg/dL (ref 0.0–149.0)
VLDL: 28 mg/dL (ref 0.0–40.0)

## 2021-10-13 LAB — COMPREHENSIVE METABOLIC PANEL
ALT: 28 U/L (ref 0–35)
AST: 25 U/L (ref 0–37)
Albumin: 4.6 g/dL (ref 3.5–5.2)
Alkaline Phosphatase: 89 U/L (ref 39–117)
BUN: 21 mg/dL (ref 6–23)
CO2: 31 mEq/L (ref 19–32)
Calcium: 9.7 mg/dL (ref 8.4–10.5)
Chloride: 101 mEq/L (ref 96–112)
Creatinine, Ser: 1.08 mg/dL (ref 0.40–1.20)
GFR: 54.91 mL/min — ABNORMAL LOW (ref >60.00)
Glucose, Bld: 104 mg/dL — ABNORMAL HIGH (ref 70–99)
Potassium: 3.6 mEq/L (ref 3.5–5.1)
Sodium: 137 mEq/L (ref 135–145)
Total Bilirubin: 0.6 mg/dL (ref 0.2–1.2)
Total Protein: 7.5 g/dL (ref 6.0–8.3)

## 2021-10-13 LAB — TSH: TSH: 3.47 u[IU]/mL (ref 0.35–5.50)

## 2021-10-13 LAB — HEMOGLOBIN A1C: Hgb A1c MFr Bld: 5.7 % (ref 4.6–6.5)

## 2021-10-13 LAB — LIPASE: Lipase: 21 U/L (ref 11.0–59.0)

## 2021-10-13 MED ORDER — EZETIMIBE 10 MG PO TABS: 10.0000 mg | ORAL_TABLET | Freq: Every day | ORAL | 3 refills | Status: DC

## 2021-10-13 NOTE — Telephone Encounter (Signed)
Updated patient on her labs and [place]   US showed    IMPRESSION: 1. The study is limited due to patient body habitus. 2. The gallbladder wall is borderline to mildly thickened. There may be a 5 mm stone near the neck of the gallbladder. No Murphy's sign or pericholecystic fluid. If clinical concern persists, a HIDA scan could further evaluate. 3. The common bile duct measures 6.6 mm. 6 mm is the upper limits of normal. Nonshadowing echogenic foci were questioned on the limited views of the common bile duct. It is possible the possible filling defects are simply artifact. Sludge or stones would be difficult to exclude on provided images. Artifact is favored. Recommend correlation with labs. If there is concern for biliary obstruction, recommend MRCP. 4. Probable hepatic steatosis.  She is ok with doing a HIDA scan   Her labs showed a normal lipase   Lab Results  Component Value Date   CHOL 264 (H) 10/13/2021   HDL 82.70 10/13/2021   LDLCALC 154 (H) 10/13/2021   LDLDIRECT 137.0 01/25/2015   TRIG 140.0 10/13/2021   CHOLHDL 3 10/13/2021   She has been on a Crestor which caused muscle aches. She is ok with trying Zetia

## 2021-10-16 LAB — HIV ANTIBODY (ROUTINE TESTING W REFLEX): HIV 1&2 Ab, 4th Generation: NONREACTIVE

## 2021-10-17 ENCOUNTER — Encounter: Payer: Self-pay | Admitting: Adult Health

## 2021-10-17 NOTE — Telephone Encounter (Signed)
Please advise 

## 2021-11-20 ENCOUNTER — Encounter: Payer: Self-pay | Admitting: Adult Health

## 2021-11-30 ENCOUNTER — Other Ambulatory Visit (HOSPITAL_COMMUNITY): Payer: 59

## 2021-12-11 ENCOUNTER — Encounter (HOSPITAL_COMMUNITY)
Admission: RE | Admit: 2021-12-11 | Discharge: 2021-12-11 | Disposition: A | Discharge status: Home / Self Care | Payer: 59 | Source: Ambulatory Visit | Attending: Adult Health | Admitting: Adult Health

## 2021-12-11 DIAGNOSIS — R1011 Right upper quadrant pain: Secondary | ICD-10-CM | POA: Diagnosis present

## 2021-12-11 MED ORDER — TECHNETIUM TC 99M MEBROFENIN IV KIT
5.3000 | PACK | Freq: Once | INTRAVENOUS | Status: AC | PRN
  Administered 2021-12-11: 5.3 via INTRAVENOUS

## 2021-12-12 ENCOUNTER — Telehealth: Payer: Self-pay | Admitting: Adult Health

## 2021-12-12 MED ORDER — SUCRALFATE 1 G PO TABS: 1.0000 g | ORAL_TABLET | Freq: Three times a day (TID) | ORAL | 0 refills | Status: DC

## 2021-12-12 NOTE — Telephone Encounter (Signed)
Updated patient on VQ scan results with showed evidence of biliary reflux She has been making a conscience decision to eat healthier, cut back on alcohol, and eat small meals but continues to have abdominal pain.   We are going to try carafate to see if that helps and she will update me in 2 weeks or so

## 2022-05-28 HISTORY — PX: CATARACT EXTRACTION: SUR2

## 2022-06-28 ENCOUNTER — Other Ambulatory Visit: Payer: Self-pay | Admitting: Neurosurgery

## 2022-06-28 DIAGNOSIS — M5440 Lumbago with sciatica, unspecified side: Secondary | ICD-10-CM

## 2022-08-21 ENCOUNTER — Ambulatory Visit
Admission: RE | Admit: 2022-08-21 | Discharge: 2022-08-21 | Disposition: A | Discharge status: Home / Self Care | Payer: 59 | Source: Ambulatory Visit | Attending: Neurosurgery | Admitting: Neurosurgery

## 2022-08-21 DIAGNOSIS — M5440 Lumbago with sciatica, unspecified side: Secondary | ICD-10-CM

## 2022-09-18 ENCOUNTER — Other Ambulatory Visit: Payer: Self-pay | Admitting: Adult Health

## 2022-09-18 DIAGNOSIS — Z1231 Encounter for screening mammogram for malignant neoplasm of breast: Secondary | ICD-10-CM

## 2022-09-26 ENCOUNTER — Encounter (INDEPENDENT_AMBULATORY_CARE_PROVIDER_SITE_OTHER): Payer: Self-pay

## 2022-10-17 ENCOUNTER — Ambulatory Visit: Payer: 59

## 2022-10-19 ENCOUNTER — Ambulatory Visit (INDEPENDENT_AMBULATORY_CARE_PROVIDER_SITE_OTHER): Payer: 59 | Admitting: Adult Health

## 2022-10-19 ENCOUNTER — Encounter: Payer: Self-pay | Admitting: Adult Health

## 2022-10-19 VITALS — BP 130/80 | HR 63 | Temp 97.8°F | Ht 66.75 in | Wt 217.0 lb

## 2022-10-19 DIAGNOSIS — Z0000 Encounter for general adult medical examination without abnormal findings: Secondary | ICD-10-CM | POA: Diagnosis not present

## 2022-10-19 DIAGNOSIS — R5383 Other fatigue: Secondary | ICD-10-CM | POA: Diagnosis not present

## 2022-10-19 DIAGNOSIS — I10 Essential (primary) hypertension: Secondary | ICD-10-CM

## 2022-10-19 DIAGNOSIS — E039 Hypothyroidism, unspecified: Secondary | ICD-10-CM | POA: Diagnosis not present

## 2022-10-19 DIAGNOSIS — G43109 Migraine with aura, not intractable, without status migrainosus: Secondary | ICD-10-CM | POA: Diagnosis not present

## 2022-10-19 DIAGNOSIS — E782 Mixed hyperlipidemia: Secondary | ICD-10-CM

## 2022-10-19 LAB — CBC WITH DIFFERENTIAL/PLATELET
Basophils Absolute: 0.1 K/uL (ref 0.0–0.1)
Basophils Relative: 1.1 % (ref 0.0–3.0)
Eosinophils Absolute: 0.1 K/uL (ref 0.0–0.7)
Eosinophils Relative: 1.4 % (ref 0.0–5.0)
HCT: 42.3 % (ref 36.0–46.0)
Hemoglobin: 13.8 g/dL (ref 12.0–15.0)
Lymphocytes Relative: 24.3 % (ref 12.0–46.0)
Lymphs Abs: 1.1 K/uL (ref 0.7–4.0)
MCHC: 32.7 g/dL (ref 30.0–36.0)
MCV: 93 fl (ref 78.0–100.0)
Monocytes Absolute: 0.3 K/uL (ref 0.1–1.0)
Monocytes Relative: 6 % (ref 3.0–12.0)
Neutro Abs: 3 K/uL (ref 1.4–7.7)
Neutrophils Relative %: 67.2 % (ref 43.0–77.0)
Platelets: 277 K/uL (ref 150.0–400.0)
RBC: 4.55 Mil/uL (ref 3.87–5.11)
RDW: 13.9 % (ref 11.5–15.5)
WBC: 4.5 K/uL (ref 4.0–10.5)

## 2022-10-19 LAB — COMPREHENSIVE METABOLIC PANEL WITH GFR
ALT: 27 U/L (ref 0–35)
AST: 27 U/L (ref 0–37)
Albumin: 4.5 g/dL (ref 3.5–5.2)
Alkaline Phosphatase: 107 U/L (ref 39–117)
BUN: 13 mg/dL (ref 6–23)
CO2: 29 meq/L (ref 19–32)
Calcium: 9.7 mg/dL (ref 8.4–10.5)
Chloride: 101 meq/L (ref 96–112)
Creatinine, Ser: 0.9 mg/dL (ref 0.40–1.20)
GFR: 67.85 mL/min
Glucose, Bld: 97 mg/dL (ref 70–99)
Potassium: 3.7 meq/L (ref 3.5–5.1)
Sodium: 139 meq/L (ref 135–145)
Total Bilirubin: 0.5 mg/dL (ref 0.2–1.2)
Total Protein: 7.7 g/dL (ref 6.0–8.3)

## 2022-10-19 LAB — IBC + FERRITIN
Ferritin: 203.7 ng/mL (ref 10.0–291.0)
Iron: 79 ug/dL (ref 42–145)
Saturation Ratios: 21.1 % (ref 20.0–50.0)
TIBC: 373.8 ug/dL (ref 250.0–450.0)
Transferrin: 267 mg/dL (ref 212.0–360.0)

## 2022-10-19 LAB — LIPID PANEL
Cholesterol: 279 mg/dL — ABNORMAL HIGH (ref 0–200)
HDL: 72 mg/dL
LDL Cholesterol: 174 mg/dL — ABNORMAL HIGH (ref 0–99)
NonHDL: 206.73
Total CHOL/HDL Ratio: 4
Triglycerides: 164 mg/dL — ABNORMAL HIGH (ref 0.0–149.0)
VLDL: 32.8 mg/dL (ref 0.0–40.0)

## 2022-10-19 LAB — TSH: TSH: 3.67 u[IU]/mL (ref 0.35–5.50)

## 2022-10-19 LAB — VITAMIN B12: Vitamin B-12: 379 pg/mL (ref 211–911)

## 2022-10-19 LAB — VITAMIN D 25 HYDROXY (VIT D DEFICIENCY, FRACTURES): VITD: 27.54 ng/mL — ABNORMAL LOW (ref 30.00–100.00)

## 2022-10-19 NOTE — Progress Notes (Signed)
Subjective:    Patient ID: Laura Caldwell, female    DOB: 1960-08-06, 62 y.o.   MRN: [id number]  HPI  Patient presents for yearly preventative medicine examination. She is a pleasant 62 year old female who  has a past medical history of ASTHMA (04/26/2009), AUTOIMMUNE DISEASE NOT ELSEWHERE  CLASSIFIED (04/19/2009), ELEVATED BP READING WITHOUT DX HYPERTENSION (12/26/2007), NASH (nonalcoholic steatohepatitis), Nonalcoholic fatty liver disease (4/33/2951), PREMATURE VENTRICULAR CONTRACTIONS (07/09/2009), and Thyroid disease.  Hypertension-managed with Maxide 37.5-25 mg daily.  She denies dizziness, lightheadedness, chest pain, or shortness of breath BP Readings from Last 3 Encounters:  10/19/22 130/80  09/29/21 120/78  08/25/20 120/80   Hypothyroidism-is currently controlled with Synthroid 50 mcg daily  Hyperlipidemia - She did try crestor in the past but this causes myalgia. She never started Zetia. Has been working on lifestyle modifications.  Lab Results  Component Value Date   CHOL 264 (H) 10/13/2021   HDL 82.70 10/13/2021   LDLCALC 154 (H) 10/13/2021   LDLDIRECT 137.0 01/25/2015   TRIG 140.0 10/13/2021   CHOLHDL 3 10/13/2021    Migraine headache-migraines are infrequent.  She will take Maxalt as needed  Fatigue -as of recently she has felt fatigued pretty much throughout the entire day.  She feels as though this may be stress related, she does a lot of driving for work, helps take care of elderly parents, and is in the process of helping plan a wedding for her son.  She does report that she snores but denies apneic episodes.  Does wake up feeling fatigued.  Chronic Low Back Pain -had an MRI in 2024 which showed significant spinal canal stenosis and for minimal narrowing throughout the lumbar spine.  She has met with Dr. Wynetta Emery at [facility] neurosurgery and is deciding on if surgery is the right option for her  All immunizations and health maintenance protocols were reviewed with the  patient and needed orders were placed.  Appropriate screening laboratory values were ordered for the patient including screening of hyperlipidemia, renal function and hepatic function.   Medication reconciliation,  past medical history, social history, problem list and allergies were reviewed in detail with the patient  Goals were established with regard to weight loss, exercise, and  diet in compliance with medications. She is working on lifestyle modifications.  Wt Readings from Last 3 Encounters:  10/19/22 217 lb (98.4 kg)  09/29/21 218 lb (98.9 kg)  08/25/20 236 lb (107 kg)    She is up to date on routine colon cancer screening and her mammogram is scheduled for next week.    Review of Systems  Constitutional:  Positive for fatigue.  HENT: Negative.    Eyes: Negative.   Respiratory: Negative.    Cardiovascular: Negative.   Gastrointestinal: Negative.   Endocrine: Negative.   Genitourinary: Negative.   Musculoskeletal:  Positive for arthralgias and back pain.  Skin: Negative.   Allergic/Immunologic: Negative.   Neurological: Negative.   Hematological: Negative.   Psychiatric/Behavioral: Negative.     Past Medical History:  Diagnosis Date   ASTHMA 04/26/2009   AUTOIMMUNE DISEASE NOT ELSEWHERE  CLASSIFIED 04/19/2009   ELEVATED BP READING WITHOUT DX HYPERTENSION 12/26/2007   NASH (nonalcoholic steatohepatitis)    Nonalcoholic fatty liver disease 08/12/2014   PREMATURE VENTRICULAR CONTRACTIONS 07/09/2009   Thyroid disease     Social History   Socioeconomic History   Marital status: Married    Spouse name: Not on file   Number of children: 2   Years of education:  Not on file   Highest education level: Not on file  Occupational History   Occupation: nurse  Tobacco Use   Smoking status: Never   Smokeless tobacco: Never  Substance and Sexual Activity   Alcohol use: Yes    Comment: occasional   Drug use: No   Sexual activity: Not on file  Other Topics Concern   Not  on file  Social History Narrative   She's an Charity fundraiser, 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   07/01/2014   Social Determinants of Health   Financial Resource Strain: Not on file  Food Insecurity: Not on file  Transportation Needs: Not on file  Physical Activity: Not on file  Stress: Not on file  Social Connections: Not on file  Intimate Partner Violence: Not on file    Past Surgical History:  Procedure Laterality Date   ABDOMINAL HYSTERECTOMY     BUNIONECTOMY WITH HAMMERTOE RECONSTRUCTION  2013   COLONOSCOPY     GANGLION CYST EXCISION Left 1995   left wrist   KNEE ARTHROSCOPY Left    WRIST RECONSTRUCTION Right 2010    Family History  Problem Relation Age of Onset   Colon cancer Neg Hx    Esophageal cancer Neg Hx    Rectal cancer Neg Hx    Stomach cancer Neg Hx    Breast cancer Neg Hx     Allergies  Allergen Reactions   Crestor [Rosuvastatin]     myalgias    Current Outpatient Medications on File Prior to Visit  Medication Sig Dispense Refill   levothyroxine (SYNTHROID) 50 MCG tablet TAKE 1 TABLET(50 MCG) BY MOUTH DAILY 90 tablet 3   rizatriptan (MAXALT-MLT) 10 MG disintegrating tablet DISSOLVE 1 TABLET BY MOUTH AS NEEDED FOR MIGRAINE. MAY REPEAT IN 2 HOURS IF NEEDED 30 tablet 2   triamterene-hydrochlorothiazide (MAXZIDE-25) 37.5-25 MG tablet TAKE 1 TABLET BY MOUTH DAILY 90 tablet 3   No current facility-administered medications on file prior to visit.    BP 130/80   Pulse 63   Temp 97.8 F (36.6 C) (Oral)   Ht 5' 6.75" (1.695 m)   Wt 217 lb (98.4 kg)   SpO2 96%   BMI 34.24 kg/m       Objective:   Physical Exam Vitals and nursing note reviewed.  Constitutional:      General: She is not in acute distress.    Appearance: Normal appearance. She is  obese. She is not ill-appearing.  HENT:     Head: Normocephalic and atraumatic.     Right Ear: Tympanic membrane, ear canal and external ear normal. There is no impacted cerumen.     Left Ear: Tympanic membrane, ear canal and external ear normal. There is no impacted cerumen.     Nose: Nose normal. No congestion or rhinorrhea.     Mouth/Throat:     Mouth: Mucous membranes are moist.     Pharynx: Oropharynx is clear.  Eyes:     Extraocular Movements: Extraocular movements intact.     Conjunctiva/sclera: Conjunctivae normal.     Pupils: Pupils are equal, round, and reactive to light.  Neck:     Vascular: No carotid bruit.  Cardiovascular:     Rate and Rhythm: Normal rate and regular rhythm.  Pulses: Normal pulses.     Heart sounds: No murmur heard.    No friction rub. No gallop.  Pulmonary:     Effort: Pulmonary effort is normal.     Breath sounds: Normal breath sounds.  Abdominal:     General: Abdomen is flat. Bowel sounds are normal. There is no distension.     Palpations: Abdomen is soft. There is no mass.     Tenderness: There is no abdominal tenderness. There is no guarding or rebound.     Hernia: No hernia is present.  Musculoskeletal:        General: Normal range of motion.     Cervical back: Normal range of motion and neck supple.  Lymphadenopathy:     Cervical: No cervical adenopathy.  Skin:    General: Skin is warm and dry.     Capillary Refill: Capillary refill takes less than 2 seconds.  Neurological:     General: No focal deficit present.     Mental Status: She is alert and oriented to person, place, and time.  Psychiatric:        Mood and Affect: Mood normal.        Behavior: Behavior normal.        Thought Content: Thought content normal.        Judgment: Judgment normal.       Assessment & Plan:   1. Routine general medical examination at a health care facility Today patient counseled on age appropriate routine health concerns for screening and  prevention, each reviewed and up to date or declined. Immunizations reviewed and up to date or declined. Labs ordered and reviewed. Risk factors for depression reviewed and negative. Hearing function and visual acuity are intact. ADLs screened and addressed as needed. Functional ability and level of safety reviewed and appropriate. Education, counseling and referrals performed based on assessed risks today. Patient provided with a copy of personalized plan for preventive services. - Continue to work on weight loss through diet and exercise - Follow up in one year or sooner if needed  2. Essential hypertension - Well controlled. No change in medication  - CBC with Differential/Platelet; Future - Comprehensive metabolic panel; Future - Lipid panel; Future - TSH; Future - TSH - Lipid panel - Comprehensive metabolic panel - CBC with Differential/Platelet  3. Migraine with aura and without status migrainosus, not intractable - Continue Maxalt as needed - CBC with Differential/Platelet; Future - Comprehensive metabolic panel; Future - Lipid panel; Future - TSH; Future - TSH - Lipid panel - Comprehensive metabolic panel - CBC with Differential/Platelet  4. Mixed hyperlipidemia - Will order CT cardiac Scoring  - CBC with Differential/Platelet; Future - Comprehensive metabolic panel; Future - Lipid panel; Future - TSH; Future - TSH - Lipid panel - Comprehensive metabolic panel - CBC with Differential/Platelet - CT CARDIAC SCORING (SELF PAY ONLY); Future  5. Hypothyroidism, unspecified type - Consider dose change  - CBC with Differential/Platelet; Future - Comprehensive metabolic panel; Future - Lipid panel; Future - TSH; Future - TSH - Lipid panel - Comprehensive metabolic panel - CBC with Differential/Platelet  6. Other fatigue - Will check labs today. Cannot r/o sleep apnea but will hold off on sleep study at patients request - CBC with Differential/Platelet; Future -  Comprehensive metabolic panel; Future - Lipid panel; Future - TSH; Future - IBC + Ferritin; Future - Vitamin B12; Future - VITAMIN D 25 Hydroxy (Vit-D Deficiency, Fractures); Future - VITAMIN D 25 Hydroxy (Vit-D Deficiency, Fractures) -  Vitamin B12 - IBC + Ferritin - TSH - Lipid panel - Comprehensive metabolic panel - CBC with Differential/Platelet  Shirline Frees, NP

## 2022-10-24 ENCOUNTER — Encounter: Payer: Self-pay | Admitting: Adult Health

## 2022-10-25 ENCOUNTER — Other Ambulatory Visit: Payer: Self-pay | Admitting: Adult Health

## 2022-10-25 ENCOUNTER — Ambulatory Visit
Admission: RE | Admit: 2022-10-25 | Discharge: 2022-10-25 | Disposition: A | Discharge status: Home / Self Care | Payer: 59 | Source: Ambulatory Visit | Attending: Adult Health | Admitting: Adult Health

## 2022-10-25 DIAGNOSIS — I10 Essential (primary) hypertension: Secondary | ICD-10-CM

## 2022-10-25 DIAGNOSIS — G43109 Migraine with aura, not intractable, without status migrainosus: Secondary | ICD-10-CM

## 2022-10-25 DIAGNOSIS — Z1231 Encounter for screening mammogram for malignant neoplasm of breast: Secondary | ICD-10-CM

## 2022-10-25 MED ORDER — TRIAMTERENE-HCTZ 37.5-25 MG PO TABS
1.0000 | ORAL_TABLET | Freq: Every day | ORAL | 3 refills | Status: DC
Start: 2022-10-25 — End: 2023-11-15

## 2022-10-25 MED ORDER — LEVOTHYROXINE SODIUM 50 MCG PO TABS: 50.0000 ug | ORAL_TABLET | Freq: Every day | ORAL | 3 refills | Status: DC

## 2022-10-25 MED ORDER — RIZATRIPTAN BENZOATE 10 MG PO TBDP
10.0000 mg | ORAL_TABLET | ORAL | 2 refills | Status: DC | PRN
Start: 2022-10-25 — End: 2023-11-15

## 2022-10-26 ENCOUNTER — Other Ambulatory Visit: Payer: Self-pay

## 2022-11-01 ENCOUNTER — Ambulatory Visit (HOSPITAL_BASED_OUTPATIENT_CLINIC_OR_DEPARTMENT_OTHER)
Admission: RE | Admit: 2022-11-01 | Discharge: 2022-11-01 | Disposition: A | Discharge status: Home / Self Care | Payer: Self-pay | Source: Ambulatory Visit | Attending: Adult Health | Admitting: Adult Health

## 2022-11-01 DIAGNOSIS — E782 Mixed hyperlipidemia: Secondary | ICD-10-CM | POA: Insufficient documentation

## 2023-03-18 ENCOUNTER — Encounter: Payer: Self-pay | Admitting: Internal Medicine

## 2023-04-02 ENCOUNTER — Encounter: Payer: Self-pay | Admitting: Internal Medicine

## 2023-05-30 ENCOUNTER — Encounter: Payer: 59 | Admitting: Internal Medicine

## 2023-09-19 ENCOUNTER — Emergency Department (HOSPITAL_BASED_OUTPATIENT_CLINIC_OR_DEPARTMENT_OTHER)
Admission: EM | Admit: 2023-09-19 | Discharge: 2023-09-20 | Disposition: A | Discharge status: Home / Self Care | Payer: PRIVATE HEALTH INSURANCE | Attending: Emergency Medicine | Admitting: Emergency Medicine

## 2023-09-19 ENCOUNTER — Emergency Department (HOSPITAL_BASED_OUTPATIENT_CLINIC_OR_DEPARTMENT_OTHER): Admitting: Radiology

## 2023-09-19 ENCOUNTER — Encounter (HOSPITAL_BASED_OUTPATIENT_CLINIC_OR_DEPARTMENT_OTHER): Payer: Self-pay

## 2023-09-19 ENCOUNTER — Other Ambulatory Visit: Payer: Self-pay

## 2023-09-19 DIAGNOSIS — R079 Chest pain, unspecified: Secondary | ICD-10-CM

## 2023-09-19 DIAGNOSIS — R0789 Other chest pain: Secondary | ICD-10-CM | POA: Insufficient documentation

## 2023-09-19 DIAGNOSIS — Z8616 Personal history of COVID-19: Secondary | ICD-10-CM | POA: Insufficient documentation

## 2023-09-19 LAB — HEPATIC FUNCTION PANEL
ALT: 50 U/L — ABNORMAL HIGH (ref 0–44)
AST: 31 U/L (ref 15–41)
Albumin: 4.7 g/dL (ref 3.5–5.0)
Alkaline Phosphatase: 169 U/L — ABNORMAL HIGH (ref 38–126)
Bilirubin, Direct: 0.1 mg/dL (ref 0.0–0.2)
Indirect Bilirubin: 0.2 mg/dL — ABNORMAL LOW (ref 0.3–0.9)
Total Bilirubin: 0.3 mg/dL (ref 0.0–1.2)
Total Protein: 8.1 g/dL (ref 6.5–8.1)

## 2023-09-19 LAB — LIPASE, BLOOD: Lipase: 30 U/L (ref 11–51)

## 2023-09-19 LAB — CBC
HCT: 39.2 % (ref 36.0–46.0)
Hemoglobin: 13.5 g/dL (ref 12.0–15.0)
MCH: 31.5 pg (ref 26.0–34.0)
MCHC: 34.4 g/dL (ref 30.0–36.0)
MCV: 91.4 fL (ref 80.0–100.0)
Platelets: 369 K/uL (ref 150–400)
RBC: 4.29 MIL/uL (ref 3.87–5.11)
RDW: 13.2 % (ref 11.5–15.5)
WBC: 9.8 K/uL (ref 4.0–10.5)
nRBC: 0 % (ref 0.0–0.2)

## 2023-09-19 LAB — BASIC METABOLIC PANEL WITH GFR
Anion gap: 16 — ABNORMAL HIGH (ref 5–15)
BUN: 17 mg/dL (ref 8–23)
CO2: 21 mmol/L — ABNORMAL LOW (ref 22–32)
Calcium: 10.6 mg/dL — ABNORMAL HIGH (ref 8.9–10.3)
Chloride: 99 mmol/L (ref 98–111)
Creatinine, Ser: 1.07 mg/dL — ABNORMAL HIGH (ref 0.44–1.00)
GFR, Estimated: 58 mL/min — ABNORMAL LOW (ref >60)
Glucose, Bld: 118 mg/dL — ABNORMAL HIGH (ref 70–99)
Potassium: 3.5 mmol/L (ref 3.5–5.1)
Sodium: 136 mmol/L (ref 135–145)

## 2023-09-19 LAB — TROPONIN T, HIGH SENSITIVITY: Troponin T High Sensitivity: <15 ng/L (ref <19)

## 2023-09-19 LAB — D-DIMER, QUANTITATIVE: D-Dimer, Quant: 0.3 ug{FEU}/mL (ref 0.00–0.50)

## 2023-09-19 NOTE — ED Triage Notes (Signed)
 Pt recently had COVID last week and has had a productive cough. Pt reports today sudden onset of L sided cp below L breast. Pt concerned she pulled a muscle.

## 2023-09-19 NOTE — ED Notes (Signed)
 Save blue, red, and gold top sent to the lab

## 2023-09-20 LAB — TROPONIN T, HIGH SENSITIVITY: Troponin T High Sensitivity: <15 ng/L (ref <19)

## 2023-09-20 MED ORDER — KETOROLAC TROMETHAMINE 30 MG/ML IJ SOLN
15.0000 mg | Freq: Once | INTRAMUSCULAR | Status: AC
  Administered 2023-09-20: 15 mg via INTRAVENOUS
  Filled 2023-09-20: qty 1

## 2023-09-20 MED ORDER — HYDROCODONE BIT-HOMATROP MBR 5-1.5 MG/5ML PO SOLN: 5.0000 mL | Freq: Four times a day (QID) | ORAL | 0 refills | Status: DC | PRN

## 2023-09-20 NOTE — ED Notes (Signed)
 AVS provided by edp was reviewed with pt. Pt verbalized understanding with no additional questions at this time. Pharmacy verified by pt. Pt wheelchair to car by this rn

## 2023-09-20 NOTE — ED Provider Notes (Signed)
 Laura Caldwell EMERGENCY DEPARTMENT AT [facility] Provider Note   CSN: [id number] Arrival date & time: 09/19/23  2243     Patient presents with: Chest Pain and Shortness of Breath   Laura Caldwell is a 63 y.o. female.   63 year old female that presents the ER today with left-sided chest pain.  Patient had COVID last week and was coughing quite a bit.  She still has some residual cough that seems to be improving.  Today she was send there had acute onset of left lateral rib pain.  Worse with certain movements, palpation, coughing and deep breaths.  Recent was on a cruise but flew to [place] and the flight was only a little bit over an hour and has no lower extremity pain or swelling.  History of blood clots, not on any estrogen.  Does not smoke or drugs.  She does drink a little bit but is never had pancreatitis and no biliary issues.   Chest Pain Associated symptoms: shortness of breath   Shortness of Breath Associated symptoms: chest pain        Prior to Admission medications   Medication Sig Start Date End Date Taking? Authorizing Provider  HYDROcodone  bit-homatropine (HYCODAN) 5-1.5 MG/5ML syrup Take 5 mLs by mouth every 6 (six) hours as needed. 09/20/23  Yes Aradhya Shellenbarger, Selinda, MD  levothyroxine  (SYNTHROID ) 50 MCG tablet Take 1 tablet (50 mcg total) by mouth daily before breakfast. 10/25/22   Nafziger, Darleene, NP  rizatriptan  (MAXALT -MLT) 10 MG disintegrating tablet Take 1 tablet (10 mg total) by mouth as needed for migraine. May repeat in 2 hours if needed 10/25/22   Merna Darleene, NP  triamterene -hydrochlorothiazide  (MAXZIDE-25) 37.5-25 MG tablet Take 1 tablet by mouth daily. 10/25/22   Merna Darleene, NP    Allergies: Crestor  [rosuvastatin ]    Review of Systems  Respiratory:  Positive for shortness of breath.   Cardiovascular:  Positive for chest pain.    Updated Vital Signs BP 90/60 (BP Location: Right Arm)   Pulse 69   Temp 99.6 F (37.6 C) (Oral)   Resp 15   Ht 5'  6 (1.676 m)   Wt 104.3 kg   SpO2 93%   BMI 37.12 kg/m   Physical Exam Vitals and nursing note reviewed.  Constitutional:      Appearance: She is well-developed.  HENT:     Head: Normocephalic and atraumatic.  Cardiovascular:     Rate and Rhythm: Normal rate and regular rhythm.  Pulmonary:     Effort: No respiratory distress.     Breath sounds: No stridor.  Abdominal:     General: There is no distension.  Musculoskeletal:     Cervical back: Normal range of motion.     Comments: Mild chest ttp under left breast, no rash, no deformity  Neurological:     Mental Status: She is alert.     (all labs ordered are listed, but only abnormal results are displayed) Labs Reviewed  BASIC METABOLIC PANEL WITH GFR - Abnormal; Notable for the following components:      Result Value   CO2 21 (*)    Glucose, Bld 118 (*)    Creatinine, Ser 1.07 (*)    Calcium  10.6 (*)    GFR, Estimated 58 (*)    Anion gap 16 (*)    All other components within normal limits  HEPATIC FUNCTION PANEL - Abnormal; Notable for the following components:   ALT 50 (*)    Alkaline Phosphatase 169 (*)  Indirect Bilirubin 0.2 (*)    All other components within normal limits  CBC  D-DIMER, QUANTITATIVE  LIPASE, BLOOD  TROPONIN T, HIGH SENSITIVITY  TROPONIN T, HIGH SENSITIVITY    EKG: None  Radiology: DG Chest Port 1 View Result Date: 09/19/2023 CLINICAL DATA:  Recent COVID diagnosis last week presenting with left-sided chest pain below the left breast. EXAM: PORTABLE CHEST 1 VIEW COMPARISON:  None Available. FINDINGS: The heart size and mediastinal contours are within normal limits. Low lung volumes are noted. There is no evidence of an acute infiltrate, pleural effusion or pneumothorax. The visualized skeletal structures are unremarkable. IMPRESSION: No active disease. Electronically Signed   By: Suzen Dials M.D.   On: 09/19/2023 23:02     Procedures   Medications Ordered in the ED  ketorolac  (TORADOL) 30 MG/ML injection 15 mg (15 mg Intravenous Given 09/20/23 0105)                                    Medical Decision Making Amount and/or Complexity of Data Reviewed Labs: ordered. Radiology: ordered. ECG/medicine tests: ordered.  Risk Prescription drug management.   Seems somewhat MSK, likely from coughing for a week but it came on while not coughing so d dimer, troponins, ecg done and cxr.  Xr shows no obvious abnormalities on my interpretation.  Trops undetectable.  D dimer reassuring. Pain significantly improved. Will use cough medicine/NSAIDs for pain control.      Final diagnoses:  Nonspecific chest pain    ED Discharge Orders          Ordered    HYDROcodone  bit-homatropine (HYCODAN) 5-1.5 MG/5ML syrup  Every 6 hours PRN        09/20/23 0204               Jeanmarie Mccowen, Selinda, MD 09/20/23 [phone]

## 2023-10-22 ENCOUNTER — Encounter: Payer: 59 | Admitting: Adult Health

## 2023-11-05 ENCOUNTER — Encounter: Payer: Self-pay | Admitting: Internal Medicine

## 2023-11-05 ENCOUNTER — Other Ambulatory Visit: Payer: Self-pay | Admitting: Adult Health

## 2023-11-05 DIAGNOSIS — Z1231 Encounter for screening mammogram for malignant neoplasm of breast: Secondary | ICD-10-CM

## 2023-11-06 ENCOUNTER — Ambulatory Visit (INDEPENDENT_AMBULATORY_CARE_PROVIDER_SITE_OTHER): Admitting: Adult Health

## 2023-11-06 ENCOUNTER — Encounter: Payer: Self-pay | Admitting: Adult Health

## 2023-11-06 VITALS — BP 110/80 | HR 73 | Temp 98.1°F | Ht 66.5 in | Wt 230.0 lb

## 2023-11-06 DIAGNOSIS — I10 Essential (primary) hypertension: Secondary | ICD-10-CM

## 2023-11-06 DIAGNOSIS — G43109 Migraine with aura, not intractable, without status migrainosus: Secondary | ICD-10-CM

## 2023-11-06 DIAGNOSIS — Z23 Encounter for immunization: Secondary | ICD-10-CM | POA: Diagnosis not present

## 2023-11-06 DIAGNOSIS — M2550 Pain in unspecified joint: Secondary | ICD-10-CM | POA: Diagnosis not present

## 2023-11-06 DIAGNOSIS — E782 Mixed hyperlipidemia: Secondary | ICD-10-CM

## 2023-11-06 DIAGNOSIS — E559 Vitamin D deficiency, unspecified: Secondary | ICD-10-CM | POA: Diagnosis not present

## 2023-11-06 DIAGNOSIS — E66812 Obesity, class 2: Secondary | ICD-10-CM

## 2023-11-06 DIAGNOSIS — Z6836 Body mass index (BMI) 36.0-36.9, adult: Secondary | ICD-10-CM

## 2023-11-06 DIAGNOSIS — E039 Hypothyroidism, unspecified: Secondary | ICD-10-CM | POA: Diagnosis not present

## 2023-11-06 LAB — CBC WITH DIFFERENTIAL/PLATELET
Basophils Absolute: 0 K/uL (ref 0.0–0.1)
Basophils Relative: 1.1 % (ref 0.0–3.0)
Eosinophils Absolute: 0.1 K/uL (ref 0.0–0.7)
Eosinophils Relative: 1.3 % (ref 0.0–5.0)
HCT: 41.2 % (ref 36.0–46.0)
Hemoglobin: 13.9 g/dL (ref 12.0–15.0)
Lymphocytes Relative: 24.1 % (ref 12.0–46.0)
Lymphs Abs: 1 K/uL (ref 0.7–4.0)
MCHC: 33.8 g/dL (ref 30.0–36.0)
MCV: 91.4 fl (ref 78.0–100.0)
Monocytes Absolute: 0.3 K/uL (ref 0.1–1.0)
Monocytes Relative: 6.4 % (ref 3.0–12.0)
Neutro Abs: 2.8 K/uL (ref 1.4–7.7)
Neutrophils Relative %: 67.1 % (ref 43.0–77.0)
Platelets: 290 K/uL (ref 150.0–400.0)
RBC: 4.51 Mil/uL (ref 3.87–5.11)
RDW: 15 % (ref 11.5–15.5)
WBC: 4.1 K/uL (ref 4.0–10.5)

## 2023-11-06 LAB — VITAMIN D 25 HYDROXY (VIT D DEFICIENCY, FRACTURES): VITD: 28.75 ng/mL — ABNORMAL LOW (ref 30.00–100.00)

## 2023-11-06 LAB — LIPID PANEL
Cholesterol: 280 mg/dL — ABNORMAL HIGH (ref 0–200)
HDL: 69.2 mg/dL (ref >39.00)
LDL Cholesterol: 166 mg/dL — ABNORMAL HIGH (ref 0–99)
NonHDL: 210.61
Total CHOL/HDL Ratio: 4
Triglycerides: 224 mg/dL — ABNORMAL HIGH (ref 0.0–149.0)
VLDL: 44.8 mg/dL — ABNORMAL HIGH (ref 0.0–40.0)

## 2023-11-06 LAB — COMPREHENSIVE METABOLIC PANEL WITH GFR
ALT: 27 U/L (ref 0–35)
AST: 25 U/L (ref 0–37)
Albumin: 4.8 g/dL (ref 3.5–5.2)
Alkaline Phosphatase: 111 U/L (ref 39–117)
BUN: 23 mg/dL (ref 6–23)
CO2: 29 meq/L (ref 19–32)
Calcium: 10.1 mg/dL (ref 8.4–10.5)
Chloride: 99 meq/L (ref 96–112)
Creatinine, Ser: 0.94 mg/dL (ref 0.40–1.20)
GFR: 63.93 mL/min (ref >60.00)
Glucose, Bld: 100 mg/dL — ABNORMAL HIGH (ref 70–99)
Potassium: 3.9 meq/L (ref 3.5–5.1)
Sodium: 137 meq/L (ref 135–145)
Total Bilirubin: 0.6 mg/dL (ref 0.2–1.2)
Total Protein: 7.7 g/dL (ref 6.0–8.3)

## 2023-11-06 LAB — TSH: TSH: 4.12 u[IU]/mL (ref 0.35–5.50)

## 2023-11-06 LAB — SEDIMENTATION RATE: Sed Rate: 5 mm/h (ref 0–30)

## 2023-11-06 LAB — C-REACTIVE PROTEIN: CRP: <1 mg/dL (ref 0.5–20.0)

## 2023-11-06 NOTE — Progress Notes (Signed)
 Subjective:    Patient ID: Laura Caldwell, female    DOB: [birth year], 63 y.o.   MRN: [id number]  HPI Patient presents for yearly preventative medicine examination. She  is a pleasant 63 year old female who  has a past medical history of ASTHMA (04/26/2009), AUTOIMMUNE DISEASE NOT ELSEWHERE  CLASSIFIED (04/19/2009), ELEVATED BP READING WITHOUT DX HYPERTENSION (12/26/2007), NASH (nonalcoholic steatohepatitis), Nonalcoholic fatty liver disease (3/83/7983), PREMATURE VENTRICULAR CONTRACTIONS (07/09/2009), and Thyroid  disease.  Hypertension-managed with Maxide 37.5-25 mg daily.  She denies dizziness, lightheadedness, chest pain, or shortness of breath BP Readings from Last 3 Encounters:  11/06/23 110/80  09/20/23 90/60  10/19/22 130/80   Hypothyroidism-is currently controlled with Synthroid  50 mcg daily  Lab Results  Component Value Date   TSH 3.67 10/19/2022   Hyperlipidemia - She did try crestor  in the past but this causes myalgia. She never started Zetia .  Lab Results  Component Value Date   CHOL 279 (H) 10/19/2022   HDL 72.00 10/19/2022   LDLCALC 174 (H) 10/19/2022   LDLDIRECT 137.0 01/25/2015   TRIG 164.0 (H) 10/19/2022   CHOLHDL 4 10/19/2022   Migraine headache-migraines are infrequent.  She will take Maxalt  as needed  Vitamin D  deficiency - Recently stopped Vitamin D  about two months ago.  Last vitamin D  Lab Results  Component Value Date   VD25OH 27.54 (L) 10/19/2022   Multiple Joint Pain - wakes up feeling stiff in the morning especially in her hands and fingers. She is dropping things. Her sister has RA. She has tested negative in the past.    All immunizations and health maintenance protocols were reviewed with the patient and needed orders were placed.  Appropriate screening laboratory values were ordered for the patient including screening of hyperlipidemia, renal function and hepatic function.  Medication reconciliation,  past medical history, social history,  problem list and allergies were reviewed in detail with the patient  Goals were established with regard to weight loss, exercise, and  diet in compliance with medications. She has not been eating healthy nor exercising on a daily basis.  Wt Readings from Last 3 Encounters:  11/06/23 230 lb (104.3 kg)  09/19/23 230 lb (104.3 kg)  10/19/22 217 lb (98.4 kg)   She has her mammogram scheduled for next week and her colonoscopy is scheduled in November.   Review of Systems  Constitutional: Negative.   HENT: Negative.    Eyes: Negative.   Respiratory: Negative.    Cardiovascular: Negative.   Gastrointestinal: Negative.   Endocrine: Negative.   Genitourinary: Negative.   Musculoskeletal:  Positive for arthralgias and back pain.  Skin: Negative.   Allergic/Immunologic: Negative.   Neurological: Negative.   Hematological: Negative.   Psychiatric/Behavioral: Negative.     Past Medical History:  Diagnosis Date   ASTHMA 04/26/2009   AUTOIMMUNE DISEASE NOT ELSEWHERE  CLASSIFIED 04/19/2009   ELEVATED BP READING WITHOUT DX HYPERTENSION 12/26/2007   NASH (nonalcoholic steatohepatitis)    Nonalcoholic fatty liver disease 08/12/2014   PREMATURE VENTRICULAR CONTRACTIONS 07/09/2009   Thyroid  disease     Social History   Socioeconomic History   Marital status: Married    Spouse name: Not on file   Number of children: 2   Years of education: Not on file   Highest education level: Not on file  Occupational History   Occupation: nurse  Tobacco Use   Smoking status: Never   Smokeless tobacco: Never  Substance and Sexual Activity   Alcohol use: Yes    Comment:  occasional   Drug use: No   Sexual activity: Not on file  Other Topics Concern   Not on file  Social History Narrative   She's an Charity fundraiser, 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   07/01/2014   Social Drivers of Corporate investment banker Strain: Not on file  Food Insecurity: Not on file  Transportation Needs: Not on file  Physical Activity: Not on file  Stress: Not on file  Social Connections: Not on file  Intimate Partner Violence: Not on file    Past Surgical History:  Procedure Laterality Date   ABDOMINAL HYSTERECTOMY     BUNIONECTOMY WITH HAMMERTOE RECONSTRUCTION  02/27/2011   CATARACT EXTRACTION Bilateral 05/2022   COLONOSCOPY     GANGLION CYST EXCISION Left 02/26/1993   left wrist   KNEE ARTHROSCOPY Left    WRIST RECONSTRUCTION Right 02/27/2008    Family History  Problem Relation Age of Onset   Colon cancer Neg Hx    Esophageal cancer Neg Hx    Rectal cancer Neg Hx    Stomach cancer Neg Hx    Breast cancer Neg Hx     Allergies  Allergen Reactions   Crestor  [Rosuvastatin ]     myalgias    Current Outpatient Medications on File Prior to Visit  Medication Sig Dispense Refill   levothyroxine  (SYNTHROID ) 50 MCG tablet Take 1 tablet (50 mcg total) by mouth daily before breakfast. 90 tablet 3   rizatriptan  (MAXALT -MLT) 10 MG disintegrating tablet Take 1 tablet (10 mg total) by mouth as needed for migraine. May repeat in 2 hours if needed 30 tablet 2   triamterene -hydrochlorothiazide  (MAXZIDE-25) 37.5-25 MG tablet Take 1 tablet by mouth daily. 90 tablet 3   No current facility-administered medications on file prior to visit.    BP 110/80   Pulse 73   Temp 98.1 F (36.7 C) (Oral)   Ht 5' 6.5 (1.689 m)   Wt 230 lb (104.3 kg)   SpO2 98%   BMI 36.57 kg/m       Objective:   Physical Exam Vitals and nursing note reviewed.  Constitutional:      General: She is not in acute distress.    Appearance: Normal appearance. She is not ill-appearing.  HENT:     Head: Normocephalic and atraumatic.     Right Ear: Tympanic membrane, ear canal and external ear normal. There is no impacted cerumen.      Left Ear: Tympanic membrane, ear canal and external ear normal. There is no impacted cerumen.     Nose: Nose normal. No congestion or rhinorrhea.     Mouth/Throat:     Mouth: Mucous membranes are moist.     Pharynx: Oropharynx is clear.  Eyes:     Extraocular Movements: Extraocular movements intact.     Conjunctiva/sclera: Conjunctivae normal.     Pupils: Pupils are equal, round, and reactive to light.  Neck:     Vascular: No carotid bruit.  Cardiovascular:     Rate and Rhythm: Normal rate and regular rhythm.     Pulses: Normal pulses.     Heart sounds: No murmur heard.    No friction rub. No gallop.  Pulmonary:     Effort: Pulmonary effort is normal.  Breath sounds: Normal breath sounds.  Abdominal:     General: Abdomen is flat. Bowel sounds are normal. There is no distension.     Palpations: Abdomen is soft. There is no mass.     Tenderness: There is no abdominal tenderness. There is no guarding or rebound.     Hernia: No hernia is present.  Musculoskeletal:        General: Normal range of motion.     Cervical back: Normal range of motion and neck supple.  Lymphadenopathy:     Cervical: No cervical adenopathy.  Skin:    General: Skin is warm and dry.     Capillary Refill: Capillary refill takes less than 2 seconds.  Neurological:     General: No focal deficit present.     Mental Status: She is alert and oriented to person, place, and time.  Psychiatric:        Mood and Affect: Mood normal.        Behavior: Behavior normal.        Thought Content: Thought content normal.        Judgment: Judgment normal.       Assessment & Plan:  1. Essential hypertension (Primary) - Well controlled. No change in medication  - CBC with Differential/Platelet; Future - Comprehensive metabolic panel with GFR; Future - Lipid panel; Future  2. Mixed hyperlipidemia - Likely elevated. Consider alternative statin  - CBC with Differential/Platelet; Future - Comprehensive metabolic  panel with GFR; Future - Lipid panel; Future  3. Hypothyroidism, unspecified type - Consider dose change of synthroid   - CBC with Differential/Platelet; Future - Comprehensive metabolic panel with GFR; Future - Lipid panel; Future - TSH; Future  4. Migraine with aura and without status migrainosus, not intractable - Continue imitrex as needed  5. Vitamin D  deficiency  - VITAMIN D  25 Hydroxy (Vit-D Deficiency, Fractures); Future  6. Multiple joint pain - Will check inflammatory labs today  - Consider referral to Rheumatology  - Rheumatoid Factor; Future - Sedimentation Rate; Future - C-reactive Protein; Future  7. Class 2 obesity - Encouraged lifestyle modifications   8. BMI 36.0-36.9,adult   9. Need for pneumococcal vaccine  - Pneumococcal conjugate vaccine 20-valent (Prevnar 20)  10. Need for influenza vaccination  - Flu vaccine HIGH DOSE PF(Fluzone Trivalent)   Mason Burleigh, NP

## 2023-11-06 NOTE — Telephone Encounter (Signed)
**Note De-identified  [place] Obfuscation** Please advise 

## 2023-11-06 NOTE — Patient Instructions (Addendum)
It was great seeing you today   We will follow up with you regarding your lab work   Please let me know if you need anything   Please work on weight loss through diet and exercise   

## 2023-11-07 ENCOUNTER — Other Ambulatory Visit: Payer: Self-pay | Admitting: Adult Health

## 2023-11-07 ENCOUNTER — Ambulatory Visit: Payer: Self-pay | Admitting: Adult Health

## 2023-11-07 LAB — RHEUMATOID FACTOR: Rheumatoid fact SerPl-aCnc: <10 [IU]/mL (ref <14)

## 2023-11-07 MED ORDER — PITAVASTATIN CALCIUM 2 MG PO TABS: 2.0000 mg | ORAL_TABLET | Freq: Every day | ORAL | 1 refills | Status: AC

## 2023-11-12 ENCOUNTER — Ambulatory Visit
Admission: RE | Admit: 2023-11-12 | Discharge: 2023-11-12 | Disposition: A | Discharge status: Home / Self Care | Payer: PRIVATE HEALTH INSURANCE | Source: Ambulatory Visit | Attending: Adult Health

## 2023-11-12 DIAGNOSIS — Z1231 Encounter for screening mammogram for malignant neoplasm of breast: Secondary | ICD-10-CM

## 2023-11-15 ENCOUNTER — Other Ambulatory Visit: Payer: Self-pay | Admitting: Adult Health

## 2023-11-15 DIAGNOSIS — G43109 Migraine with aura, not intractable, without status migrainosus: Secondary | ICD-10-CM

## 2023-11-15 DIAGNOSIS — I10 Essential (primary) hypertension: Secondary | ICD-10-CM

## 2023-11-19 ENCOUNTER — Encounter: Payer: Self-pay | Admitting: Adult Health

## 2023-12-03 ENCOUNTER — Other Ambulatory Visit: Payer: Self-pay | Admitting: Adult Health

## 2023-12-03 ENCOUNTER — Encounter: Payer: Self-pay | Admitting: Adult Health

## 2023-12-03 MED ORDER — REPATHA SURECLICK 140 MG/ML ~~LOC~~ SOAJ: 140.0000 mg | SUBCUTANEOUS | 2 refills | Status: DC

## 2023-12-03 NOTE — Telephone Encounter (Signed)
**Note De-identified  [place] Obfuscation** Please advise 

## 2023-12-10 ENCOUNTER — Ambulatory Visit (AMBULATORY_SURGERY_CENTER): Payer: PRIVATE HEALTH INSURANCE

## 2023-12-10 VITALS — Ht 66.0 in | Wt 230.0 lb

## 2023-12-10 DIAGNOSIS — Z1211 Encounter for screening for malignant neoplasm of colon: Secondary | ICD-10-CM

## 2023-12-10 MED ORDER — NA SULFATE-K SULFATE-MG SULF 17.5-3.13-1.6 GM/177ML PO SOLN: 1.0000 | Freq: Once | ORAL | 0 refills | Status: AC

## 2023-12-10 NOTE — Progress Notes (Signed)

## 2023-12-30 NOTE — Progress Notes (Unsigned)
 [facility] History and Physical   Primary Care Physician:  Merna Huxley, NP   Reason for Procedure:    Encounter Diagnosis  Name Primary?   Colon cancer screening Yes     Plan:    Colonoscopy     HPI: Laura Caldwell is a 63 y.o. female returning for repeat screening colonoscopy, she had a normal colonoscopy performed in January 2015.   Past Medical History:  Diagnosis Date   Allergy    ASTHMA 04/26/2009   AUTOIMMUNE DISEASE NOT ELSEWHERE  CLASSIFIED 04/19/2009   ELEVATED BP READING WITHOUT DX HYPERTENSION 12/26/2007   Hyperlipidemia    NASH (nonalcoholic steatohepatitis)    Nonalcoholic fatty liver disease 08/12/2014   PREMATURE VENTRICULAR CONTRACTIONS 07/09/2009   Thyroid  disease     Past Surgical History:  Procedure Laterality Date   ABDOMINAL HYSTERECTOMY     BUNIONECTOMY WITH HAMMERTOE RECONSTRUCTION  02/27/2011   CATARACT EXTRACTION Bilateral 05/2022   COLONOSCOPY     GANGLION CYST EXCISION Left 02/26/1993   left wrist   KNEE ARTHROSCOPY Left    WRIST RECONSTRUCTION Right 02/27/2008     Current Outpatient Medications  Medication Sig Dispense Refill   acetaminophen  (TYLENOL ) 500 MG tablet Oral     Cholecalciferol (VITAMIN D -3) 125 MCG (5000 UT) TABS      Evolocumab (REPATHA SURECLICK) 140 MG/ML SOAJ Inject 140 mg into the skin every 14 (fourteen) days. 2 mL 2   levothyroxine  (SYNTHROID ) 50 MCG tablet TAKE 1 TABLET(50 MCG) BY MOUTH DAILY BEFORE BREAKFAST 90 tablet 3   Pitavastatin  Calcium  2 MG TABS Take 1 tablet (2 mg total) by mouth daily. (Patient not taking: Reported on 12/10/2023) 90 tablet 1   rizatriptan  (MAXALT -MLT) 10 MG disintegrating tablet DISSOLVE 1 TABLET BY MOUTH AS NEEDED FOR MIGRAINE. MAY REPEAT IN 2 HOURS IF NEEDED 30 tablet 2   triamterene -hydrochlorothiazide  (MAXZIDE-25) 37.5-25 MG tablet TAKE 1 TABLET BY MOUTH DAILY 90 tablet 3   No current facility-administered medications for this visit.    Allergies as of  12/31/2023 - Review Complete 12/10/2023  Allergen Reaction Noted   Rosuvastatin  Other (See Comments) 09/29/2021    Family History  Problem Relation Age of Onset   Colon cancer Neg Hx    Esophageal cancer Neg Hx    Rectal cancer Neg Hx    Stomach cancer Neg Hx    Breast cancer Neg Hx    Colon polyps Neg Hx     Social History   Socioeconomic History   Marital status: Married    Spouse name: Not on file   Number of children: 2   Years of education: Not on file   Highest education level: Not on file  Occupational History   Occupation: nurse  Tobacco Use   Smoking status: Never   Smokeless tobacco: Never  Vaping Use   Vaping status: Never Used  Substance and Sexual Activity   Alcohol use: Yes    Comment: occasional   Drug use: No   Sexual activity: Not on file  Other Topics Concern   Not on file  Social History Narrative   She's an CHARITY FUNDRAISER, 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   07/01/2014   Social Drivers of Health   Financial Resource Strain: Not on  file  Food Insecurity: Not on file  Transportation Needs: Not on file  Physical Activity: Not on file  Stress: Not on file  Social Connections: Not on file  Intimate Partner Violence: Not on file    Review of Systems: Positive for *** All other review of systems negative except as mentioned in the HPI.  Physical Exam: Vital signs There were no vitals taken for this visit.  General:   Alert,  Well-developed, well-nourished, pleasant and cooperative in NAD Lungs:  Clear throughout to auscultation.   Heart:  Regular rate and rhythm; no murmurs, clicks, rubs,  or gallops. Abdomen:  Soft, nontender and nondistended. Normal bowel sounds.   Neuro/Psych:  Alert and cooperative. Normal mood and affect. A and O x  3   @Gibran Veselka  Laura Commander, MD, [facility] [phone] (pager) 12/30/2023 6:33 PM@

## 2023-12-31 ENCOUNTER — Encounter: Payer: Self-pay | Admitting: Internal Medicine

## 2023-12-31 ENCOUNTER — Ambulatory Visit: Payer: PRIVATE HEALTH INSURANCE | Admitting: Internal Medicine

## 2023-12-31 VITALS — BP 120/69 | HR 71 | Temp 98.0°F | Resp 9 | Ht 66.5 in | Wt 230.0 lb

## 2023-12-31 DIAGNOSIS — Z1211 Encounter for screening for malignant neoplasm of colon: Secondary | ICD-10-CM | POA: Diagnosis not present

## 2023-12-31 DIAGNOSIS — D123 Benign neoplasm of transverse colon: Secondary | ICD-10-CM | POA: Diagnosis not present

## 2023-12-31 MED ORDER — SODIUM CHLORIDE 0.9 % IV SOLN: 500.0000 mL | Freq: Once | INTRAVENOUS | Status: DC

## 2023-12-31 NOTE — Patient Instructions (Addendum)
 I found and removed one small polyp. I will let you know pathology results and when to have another routine colonoscopy by mail and/or My Chart.  All else normal.  I appreciate the opportunity to care for you. Lupita CHARLENA Commander, MD, [facility]   Handouts given: Polyps Resume previous diet. Continue present medications.  Await pathology results. Repeat colonoscopy is recommended. The colonoscopy date will be determined after pathology results from today's visit become available for review.   YOU HAD AN ENDOSCOPIC PROCEDURE TODAY AT THE [facility]:   Refer to the procedure report that was given to you for any specific questions about what was found during the examination.  If the procedure report does not answer your questions, please call your gastroenterologist to clarify.  If you requested that your care partner not be given the details of your procedure findings, then the procedure report has been included in a sealed envelope for you to review at your convenience later.  YOU SHOULD EXPECT: Some feelings of bloating in the abdomen. Passage of more gas than usual.  Walking can help get rid of the air that was put into your GI tract during the procedure and reduce the bloating. If you had a lower endoscopy (such as a colonoscopy or flexible sigmoidoscopy) you may notice spotting of blood in your stool or on the toilet paper. If you underwent a bowel prep for your procedure, you may not have a normal bowel movement for a few days.  Please Note:  You might notice some irritation and congestion in your nose or some drainage.  This is from the oxygen used during your procedure.  There is no need for concern and it should clear up in a day or so.  SYMPTOMS TO REPORT IMMEDIATELY:  Following lower endoscopy (colonoscopy or flexible sigmoidoscopy):  Excessive amounts of blood in the stool  Significant tenderness or worsening of abdominal pains  Swelling of the abdomen that is new,  acute  Fever of 100F or higher  For urgent or emergent issues, a gastroenterologist can be reached at any hour by calling [phone]. Do not use MyChart messaging for urgent concerns.    DIET:  We do recommend a small meal at first, but then you may proceed to your regular diet.  Drink plenty of fluids but you should avoid alcoholic beverages for 24 hours.  ACTIVITY:  You should plan to take it easy for the rest of today and you should NOT DRIVE or use heavy machinery until tomorrow (because of the sedation medicines used during the test).    FOLLOW UP: Our staff will call the number listed on your records the next business day following your procedure.  We will call around 7:15- 8:00 am to check on you and address any questions or concerns that you may have regarding the information given to you following your procedure. If we do not reach you, we will leave a message.     If any biopsies were taken you will be contacted by phone or by letter within the next 1-3 weeks.  Please call us  at [phone] if you have not heard about the biopsies in 3 weeks.    SIGNATURES/CONFIDENTIALITY: You and/or your care partner have signed paperwork which will be entered into your electronic medical record.  These signatures attest to the fact that that the information above on your After Visit Summary has been reviewed and is understood.  Full responsibility of the confidentiality of this discharge information  lies with you and/or your care-partner.

## 2023-12-31 NOTE — Progress Notes (Signed)
 Called to room to assist during endoscopic procedure.  Patient ID and intended procedure confirmed with present staff. Received instructions for my participation in the procedure from the performing physician.

## 2023-12-31 NOTE — Progress Notes (Signed)
To PACU, VSS. Report to [email or web address] 

## 2023-12-31 NOTE — Op Note (Signed)
 [facility] Patient Name: Laura Caldwell Procedure Date: 12/31/2023 11:02 AM MRN: [id number] Endoscopist: Lupita FORBES Commander , MD, [id number] Age: 63 Referring MD:  Date of Birth: 09/24/1960 Gender: Female Account #: [id number] Procedure:                Colonoscopy Indications:              Screening for colorectal malignant neoplasm, Last                            colonoscopy: January 2015 Medicines:                Monitored Anesthesia Care Procedure:                Pre-Anesthesia Assessment:                           - Prior to the procedure, a History and Physical                            was performed, and patient medications and                            allergies were reviewed. The patient's tolerance of                            previous anesthesia was also reviewed. The risks                            and benefits of the procedure and the sedation                            options and risks were discussed with the patient.                            All questions were answered, and informed consent                            was obtained. Prior Anticoagulants: The patient has                            taken no anticoagulant or antiplatelet agents. ASA                            Grade Assessment: II - A patient with mild systemic                            disease. After reviewing the risks and benefits,                            the patient was deemed in satisfactory condition to                            undergo the procedure.  After obtaining informed consent, the colonoscope                            was passed under direct vision. Throughout the                            procedure, the patient's blood pressure, pulse, and                            oxygen saturations were monitored continuously. The                            PCF-HQ190L Colonoscope [id number] was introduced                            through the anus and advanced to  the the cecum,                            identified by appendiceal orifice and ileocecal                            valve. The colonoscopy was performed without                            difficulty. The patient tolerated the procedure                            well. The quality of the bowel preparation was                            good. The ileocecal valve, appendiceal orifice, and                            rectum were photographed. The bowel preparation                            used was SUPREP via split dose instruction. Scope In: 11:14:07 AM Scope Out: 11:23:44 AM Scope Withdrawal Time: 0 hours 7 minutes 51 seconds  Total Procedure Duration: 0 hours 9 minutes 37 seconds  Findings:                 The perianal and digital rectal examinations were                            normal.                           An 8 mm polyp was found in the splenic flexure. The                            polyp was flat. The polyp was removed with a cold                            snare. Resection and retrieval were complete.  Verification of patient identification for the                            specimen was done. Estimated blood loss was minimal.                           The exam was otherwise without abnormality on                            direct and retroflexion views. Complications:            No immediate complications. Estimated Blood Loss:     Estimated blood loss was minimal. Impression:               - One 8 mm polyp at the splenic flexure, removed                            with a cold snare. Resected and retrieved.                           - The examination was otherwise normal on direct                            and retroflexion views. Recommendation:           - Patient has a contact number available for                            emergencies. The signs and symptoms of potential                            delayed complications were discussed with the                             patient. Return to normal activities tomorrow.                            Written discharge instructions were provided to the                            patient.                           - Resume previous diet.                           - Continue present medications.                           - Await pathology results.                           - Repeat colonoscopy is recommended. The                            colonoscopy date will be determined after pathology  results from today's exam become available for                            review. Lupita FORBES Commander, MD 12/31/2023 11:29:59 AM This report has been signed electronically.

## 2024-01-01 ENCOUNTER — Telehealth: Payer: Self-pay | Admitting: *Deleted

## 2024-01-01 NOTE — Telephone Encounter (Signed)
  Follow up Call-     12/31/2023   10:01 AM  Call back number  Post procedure Call Back phone  # [phone]  Permission to leave phone message Yes     Patient questions:  Do you have a fever, pain , or abdominal swelling? No. Pain Score  0 *  Have you tolerated food without any problems? Yes.    Have you been able to return to your normal activities? Yes.    Do you have any questions about your discharge instructions: Diet   No. Medications  No. Follow up visit  No.  Do you have questions or concerns about your Care? No.  Actions: * If pain score is 4 or above: No action needed, pain <4.

## 2024-01-02 LAB — SURGICAL PATHOLOGY

## 2024-01-05 ENCOUNTER — Encounter: Payer: Self-pay | Admitting: Internal Medicine

## 2024-01-05 ENCOUNTER — Ambulatory Visit: Payer: Self-pay | Admitting: Internal Medicine

## 2024-01-05 DIAGNOSIS — Z860101 Personal history of adenomatous and serrated colon polyps: Secondary | ICD-10-CM | POA: Insufficient documentation

## 2024-02-10 ENCOUNTER — Other Ambulatory Visit (HOSPITAL_COMMUNITY): Payer: Self-pay

## 2024-02-10 ENCOUNTER — Telehealth: Payer: Self-pay

## 2024-02-10 NOTE — Telephone Encounter (Signed)
 Pharmacy Patient Advocate Encounter   Received notification from Onbase that prior authorization for Repatha  Sureclick 140 is required/requested.   Insurance verification completed.   The patient is insured through [employer].   Per test claim: PA required; PA submitted to above mentioned insurance via Latent Key/confirmation #/EOC [id number] Status is pending

## 2024-02-11 ENCOUNTER — Other Ambulatory Visit (HOSPITAL_COMMUNITY): Payer: Self-pay

## 2024-02-11 NOTE — Telephone Encounter (Signed)
 Pharmacy Patient Advocate Encounter  Received notification from [employer] that Prior Authorization for Repatha  SureClick 140 has been APPROVED from 01/11/24 to 02/09/25. Unable to obtain price due to refill too soon rejection, last fill date 02/11/24 next available fill date1/5/26   PA #/Case ID/Reference #: # [id number]

## 2024-02-11 NOTE — Telephone Encounter (Signed)
 Noted

## 2024-02-12 NOTE — Telephone Encounter (Signed)
 Left message to return phone call.

## 2024-03-03 ENCOUNTER — Encounter: Payer: Self-pay | Admitting: Adult Health

## 2024-03-03 ENCOUNTER — Ambulatory Visit (INDEPENDENT_AMBULATORY_CARE_PROVIDER_SITE_OTHER): Admitting: Adult Health

## 2024-03-03 VITALS — BP 120/80 | HR 80 | Temp 98.3°F | Ht 66.0 in | Wt 243.0 lb

## 2024-03-03 DIAGNOSIS — R1012 Left upper quadrant pain: Secondary | ICD-10-CM

## 2024-03-03 DIAGNOSIS — E66812 Obesity, class 2: Secondary | ICD-10-CM | POA: Diagnosis not present

## 2024-03-03 DIAGNOSIS — N951 Menopausal and female climacteric states: Secondary | ICD-10-CM

## 2024-03-03 LAB — AMYLASE: Amylase: 70 U/L (ref 27–131)

## 2024-03-03 LAB — COMPREHENSIVE METABOLIC PANEL WITH GFR
ALT: 38 U/L — ABNORMAL HIGH (ref 3–35)
AST: 33 U/L (ref 5–37)
Albumin: 4.8 g/dL (ref 3.5–5.2)
Alkaline Phosphatase: 114 U/L (ref 39–117)
BUN: 19 mg/dL (ref 6–23)
CO2: 28 meq/L (ref 19–32)
Calcium: 10 mg/dL (ref 8.4–10.5)
Chloride: 102 meq/L (ref 96–112)
Creatinine, Ser: 1.04 mg/dL (ref 0.40–1.20)
GFR: 56.5 mL/min — ABNORMAL LOW
Glucose, Bld: 94 mg/dL (ref 70–99)
Potassium: 4.1 meq/L (ref 3.5–5.1)
Sodium: 138 meq/L (ref 135–145)
Total Bilirubin: 0.4 mg/dL (ref 0.2–1.2)
Total Protein: 8 g/dL (ref 6.0–8.3)

## 2024-03-03 LAB — LIPASE: Lipase: 17 U/L (ref 11.0–59.0)

## 2024-03-03 MED ORDER — SUCRALFATE 1 G PO TABS: 1.0000 g | ORAL_TABLET | Freq: Three times a day (TID) | ORAL | 0 refills | Status: AC

## 2024-03-03 MED ORDER — PANTOPRAZOLE SODIUM 40 MG PO TBEC: 40.0000 mg | DELAYED_RELEASE_TABLET | Freq: Every day | ORAL | 0 refills | Status: AC

## 2024-03-03 NOTE — Progress Notes (Signed)
 "  Subjective:    Patient ID: Laura Caldwell, female    DOB: 1961/01/06, 64 y.o.   MRN: [id number]  HPI  Discussed the use of AI scribe software for clinical note transcription with the patient, who gave verbal consent to proceed.  History of Present Illness   Laura Caldwell is a 64 year old female who presents with abdominal pain and weight gain.  She reports left upper abdominal pain for multiple months.  She describes the pain as pinching and crampy, mostly positional and not related to food or drink. She has intermittent nausea without vomiting and occasional acid reflux, worse with wine or acidic foods. She was evaluated in the ER at symptom onset where cardiac causes were ruled out, but the pain persists.   She has not been eating healthy and reports she has gained about 30 pounds since back surgery last November, which she attributes to stress eating and drinking and reduced activity because back spasms and pain limit prolonged walking.   She had a prior colonoscopy that showed a small benign tubular adenoma. She denies blood in stool and fever.   She would also like to be referred for bone density screening       Review of Systems See HPI   Past Medical History:  Diagnosis Date   Allergy    ASTHMA 04/26/2009   AUTOIMMUNE DISEASE NOT ELSEWHERE  CLASSIFIED 04/19/2009   ELEVATED BP READING WITHOUT DX HYPERTENSION 12/26/2007   Hx of adenomatous polyp of colon 01/05/2024   Hyperlipidemia    NASH (nonalcoholic steatohepatitis)    Nonalcoholic fatty liver disease 08/12/2014   PREMATURE VENTRICULAR CONTRACTIONS 07/09/2009   Thyroid  disease     Social History   Socioeconomic History   Marital status: Married    Spouse name: Not on file   Number of children: 2   Years of education: Not on file   Highest education level: Not on file  Occupational History   Occupation: nurse  Tobacco Use   Smoking status: Never   Smokeless tobacco: Never  Vaping Use   Vaping status:  Never Used  Substance and Sexual Activity   Alcohol use: Yes    Comment: occasional   Drug use: No   Sexual activity: Not on file  Other Topics Concern   Not on file  Social History Narrative   She's an [employer], about to graduate with a bachelor's degree in nursing   Works for [employer], currently health visitor at [employer] [facility]   2 cups coffee daily, occasional wine on the weekends 2 or 3   Married, 2 sons one born in 62 and 1 in 1997.   07/01/2014   Social Drivers of Health   Tobacco Use: Low Risk (03/03/2024)   Patient History    Smoking Tobacco Use: Never    Smokeless Tobacco Use: Never    Passive Exposure: Not on file  Financial Resource Strain: Not on file  Food Insecurity: Not on file  Transportation Needs: Not on file  Physical Activity: Not on file  Stress: Not on file  Social Connections: Not on file  Intimate Partner Violence: Not on file  Depression (PHQ2-9): Low Risk (11/06/2023)   Depression (PHQ2-9)    PHQ-2 Score: 0  Alcohol Screen: Not on file  Housing: Not on file  Utilities: Not on file  Health Literacy: Not on file    Past Surgical History:  Procedure Laterality Date   ABDOMINAL HYSTERECTOMY     BUNIONECTOMY WITH  HAMMERTOE RECONSTRUCTION  02/27/2011   CATARACT EXTRACTION Bilateral 05/2022   COLONOSCOPY     GANGLION CYST EXCISION Left 02/26/1993   left wrist   KNEE ARTHROSCOPY Left    WRIST RECONSTRUCTION Right 02/27/2008    Family History  Problem Relation Age of Onset   Colon cancer Neg Hx    Esophageal cancer Neg Hx    Rectal cancer Neg Hx    Stomach cancer Neg Hx    Breast cancer Neg Hx    Colon polyps Neg Hx     Allergies[1]  Medications Ordered Prior to Encounter[2]  BP 120/80   Pulse 80   Temp 98.3 F (36.8 C) (Oral)   Ht 5' 6 (1.676 m)   Wt 243 lb (110.2 kg)   SpO2 97%   BMI 39.22 kg/m       Objective:   Physical Exam Vitals and nursing note reviewed.  Constitutional:      Appearance: She is  well-developed.  Cardiovascular:     Rate and Rhythm: Regular rhythm.     Pulses: Normal pulses.     Heart sounds: Normal heart sounds.  Pulmonary:     Effort: Pulmonary effort is normal.     Breath sounds: Normal breath sounds.  Abdominal:     General: Abdomen is flat. Bowel sounds are normal. There is no distension.     Palpations: Abdomen is soft. There is no mass.     Tenderness: There is abdominal tenderness in the epigastric area and left upper quadrant.   Skin:    General: Skin is warm and dry.  Neurological:     General: No focal deficit present.     Mental Status: She is alert and oriented to person, place, and time.  Psychiatric:        Mood and Affect: Mood normal.        Behavior: Behavior normal.        Thought Content: Thought content normal.        Judgment: Judgment normal.       Assessment & Plan:  Assessment and Plan    Left upper quadrant abdominal pain Chronic pain for about 6 months.  Previous cardiac evaluation normal. Recent colonoscopy showed tubular adenoma polyp without high-grade dysplasia or malignancy. - Ordered CT abdomen with contrast. - Prescribed Carafate  and Protonix  for 30 days. - Checked lab work including lipase, and amylase.  Obesity Significant weight gain of 30 pounds since back surgery, attributed to stress eating and drinking. She aims to lose 10 pounds before a trip to [place] . - Encouraged lifestyle modifications for weight loss. - Discussed potential benefits of weight loss on overall health.   Post Menopausal symptoms  - Referral for bone density     Darleene Shape, NP       [1]  Allergies Allergen Reactions   Rosuvastatin  Other (See Comments)    myalgias  rosuvastatin   [2]  Current Outpatient Medications on File Prior to Visit  Medication Sig Dispense Refill   acetaminophen  (TYLENOL ) 500 MG tablet Oral     Cholecalciferol (VITAMIN D -3) 125 MCG (5000 UT) TABS      Evolocumab  (REPATHA  SURECLICK) 140 MG/ML SOAJ  Inject 140 mg into the skin every 14 (fourteen) days. 2 mL 2   levothyroxine  (SYNTHROID ) 50 MCG tablet TAKE 1 TABLET(50 MCG) BY MOUTH DAILY BEFORE BREAKFAST 90 tablet 3   rizatriptan  (MAXALT -MLT) 10 MG disintegrating tablet DISSOLVE 1 TABLET BY MOUTH AS NEEDED FOR MIGRAINE. MAY REPEAT IN 2 HOURS IF  NEEDED 30 tablet 2   triamterene -hydrochlorothiazide  (MAXZIDE-25) 37.5-25 MG tablet TAKE 1 TABLET BY MOUTH DAILY 90 tablet 3   Pitavastatin  Calcium  2 MG TABS Take 1 tablet (2 mg total) by mouth daily. (Patient not taking: Reported on 03/03/2024) 90 tablet 1   No current facility-administered medications on file prior to visit.   "

## 2024-03-04 ENCOUNTER — Ambulatory Visit: Payer: Self-pay | Admitting: Adult Health

## 2024-03-04 NOTE — Telephone Encounter (Signed)
 FYI

## 2024-03-09 ENCOUNTER — Inpatient Hospital Stay
Admission: RE | Admit: 2024-03-09 | Discharge: 2024-03-09 | Disposition: A | Source: Ambulatory Visit | Attending: Adult Health

## 2024-03-09 DIAGNOSIS — R1012 Left upper quadrant pain: Secondary | ICD-10-CM

## 2024-03-09 MED ORDER — IOPAMIDOL (ISOVUE-370) INJECTION 76%
100.0000 mL | Freq: Once | INTRAVENOUS | Status: AC | PRN
  Administered 2024-03-09: 100 mL via INTRAVENOUS

## 2024-03-10 ENCOUNTER — Encounter: Payer: Self-pay | Admitting: Adult Health

## 2024-03-14 ENCOUNTER — Other Ambulatory Visit: Payer: Self-pay | Admitting: Adult Health

## 2024-03-17 NOTE — Telephone Encounter (Signed)
 FYI
